# Patient Record
Sex: Female | Born: 1950
Health system: Southern US, Community
[De-identification: ages and names within clinical notes are randomized; demographics above are authoritative.]

## PROBLEM LIST (undated history)

## (undated) DIAGNOSIS — K589 Irritable bowel syndrome without diarrhea: Secondary | ICD-10-CM

## (undated) DIAGNOSIS — K219 Gastro-esophageal reflux disease without esophagitis: Secondary | ICD-10-CM

## (undated) DIAGNOSIS — J45909 Unspecified asthma, uncomplicated: Secondary | ICD-10-CM

## (undated) DIAGNOSIS — R51 Headache: Secondary | ICD-10-CM

## (undated) DIAGNOSIS — R0600 Dyspnea, unspecified: Secondary | ICD-10-CM

## (undated) DIAGNOSIS — M797 Fibromyalgia: Secondary | ICD-10-CM

## (undated) DIAGNOSIS — F419 Anxiety disorder, unspecified: Secondary | ICD-10-CM

## (undated) HISTORY — PX: TONSILLECTOMY: SUR1361

## (undated) HISTORY — PX: CHOLECYSTECTOMY: SHX55

## (undated) HISTORY — PX: ABDOMINAL HYSTERECTOMY: SHX81

---

## 2001-06-08 ENCOUNTER — Other Ambulatory Visit: Admission: RE | Admit: 2001-06-08 | Discharge: 2001-06-08 | Payer: Self-pay | Admitting: *Deleted

## 2002-10-10 ENCOUNTER — Emergency Department (HOSPITAL_COMMUNITY): Admission: EM | Admit: 2002-10-10 | Discharge: 2002-10-10 | Payer: Self-pay | Admitting: Emergency Medicine

## 2003-06-04 ENCOUNTER — Emergency Department (HOSPITAL_COMMUNITY): Admission: EM | Admit: 2003-06-04 | Discharge: 2003-06-04 | Payer: Self-pay | Admitting: Emergency Medicine

## 2006-02-07 ENCOUNTER — Ambulatory Visit (HOSPITAL_COMMUNITY): Admission: RE | Admit: 2006-02-07 | Discharge: 2006-02-07 | Payer: Self-pay | Admitting: *Deleted

## 2006-02-23 ENCOUNTER — Other Ambulatory Visit: Admission: RE | Admit: 2006-02-23 | Discharge: 2006-02-23 | Payer: Self-pay | Admitting: *Deleted

## 2006-09-08 ENCOUNTER — Ambulatory Visit (HOSPITAL_COMMUNITY): Admission: RE | Admit: 2006-09-08 | Discharge: 2006-09-08 | Payer: Self-pay | Admitting: Internal Medicine

## 2006-09-08 ENCOUNTER — Encounter (INDEPENDENT_AMBULATORY_CARE_PROVIDER_SITE_OTHER): Payer: Self-pay | Admitting: *Deleted

## 2006-09-23 ENCOUNTER — Encounter (INDEPENDENT_AMBULATORY_CARE_PROVIDER_SITE_OTHER): Payer: Self-pay | Admitting: Specialist

## 2006-09-23 ENCOUNTER — Ambulatory Visit (HOSPITAL_COMMUNITY): Admission: RE | Admit: 2006-09-23 | Discharge: 2006-09-23 | Payer: Self-pay | Admitting: General Surgery

## 2007-02-27 ENCOUNTER — Ambulatory Visit (HOSPITAL_COMMUNITY): Admission: RE | Admit: 2007-02-27 | Discharge: 2007-02-27 | Payer: Self-pay | Admitting: Obstetrics & Gynecology

## 2008-05-23 ENCOUNTER — Ambulatory Visit: Payer: Self-pay | Admitting: Internal Medicine

## 2008-06-04 ENCOUNTER — Ambulatory Visit: Payer: Self-pay | Admitting: Internal Medicine

## 2008-06-04 ENCOUNTER — Encounter: Payer: Self-pay | Admitting: Internal Medicine

## 2008-06-05 ENCOUNTER — Encounter: Payer: Self-pay | Admitting: Internal Medicine

## 2008-06-10 ENCOUNTER — Ambulatory Visit (HOSPITAL_COMMUNITY): Admission: RE | Admit: 2008-06-10 | Discharge: 2008-06-10 | Payer: Self-pay | Admitting: Obstetrics and Gynecology

## 2008-10-18 DIAGNOSIS — Z8719 Personal history of other diseases of the digestive system: Secondary | ICD-10-CM

## 2008-10-23 ENCOUNTER — Ambulatory Visit: Payer: Self-pay | Admitting: Internal Medicine

## 2009-12-31 ENCOUNTER — Emergency Department (HOSPITAL_COMMUNITY): Admission: EM | Admit: 2009-12-31 | Discharge: 2009-12-31 | Payer: Self-pay | Admitting: Emergency Medicine

## 2011-05-07 NOTE — Op Note (Signed)
Melanie Brown, Melanie Brown              ACCOUNT NO.:  [id number]   MEDICAL RECORD NO.:  [id number]          PATIENT TYPE:  AMB   LOCATION:  DAY                           FACILITY:  [facility]   PHYSICIAN:  Dalia Heading, M.D.  DATE OF BIRTH:  [birth year]   DATE OF PROCEDURE:  09/23/2006  DATE OF DISCHARGE:                                 OPERATIVE REPORT   PREOPERATIVE DIAGNOSIS:  Cholecystitis, cholelithiasis.   POSTOPERATIVE DIAGNOSIS:  Cholecystitis, cholelithiasis.   PROCEDURE:  Same procedure laparoscopic cholecystectomy.   SURGEON:  Franky Macho, M.D.   ANESTHESIA:  General endotracheal.   INDICATIONS:  The patient is a 60 year old white female who presents with  cholecystitis secondary to cholelithiasis.  Risks and benefits of the  procedure including bleeding, infection, hepatobiliary injury and the  possibility of an open procedure were fully explained to the patient, who  gave informed consent.   DESCRIPTION OF PROCEDURE:  The patient was placed in the supine position.  After induction of general endotracheal anesthesia, the abdomen was prepped  and draped using the usual sterile technique with Betadine.  Surgical site  confirmation was performed.   A supraumbilical incision was made to the fascia.  A Veress needle was  introduced into the abdominal cavity and confirmation of placement was done  using the saline drop test.  The abdomen was then insufflated to 16 mmHg  pressure.  An 11 mm trocar was introduced into the abdominal cavity under  direct visualization without difficulty.  The patient was placed in reversed  Trendelenburg position.  An additional 11-mm trocar was placed in the  epigastric region and 5-mm trocars were placed in the right upper quadrant  and right flank regions.  Liver was inspected and noted to be within normal  limits.  The gallbladder was retracted superior and laterally.  Dissection  was begun around the infundibulum of the gallbladder.  The  cystic duct was  first identified.  Its juncture to the infundibulum fully identified.  Endoclips were placed proximally and distally on the cystic duct and cystic  duct was divided.  This was likewise done to the cystic artery.  The  gallbladder was then freed away from the gallbladder fossa using Bovie  electrocautery.  The gallbladder was delivered through the epigastric trocar  site using EndoCatch bag without difficulty.  The gallbladder fossa was  inspected and no abnormal bleeding or bile leakage was noted.  Surgicel was  placed in the gallbladder fossa.  All fluid was then evacuate from the  abdominal cavity prior to removal of the trocars.   All wounds were irrigated normal saline.  All wounds were over the  supraumbilical fascia were reapproximated using an 0 Vicryl interrupted  suture.  All skin incisions were closed using staples.  Betadine ointment  and dry sterile dressings were applied.   All tape and needle counts were correct at end of the procedure.  The  patient was extubated in the operating room and went back to the recovery  room awake and stable condition.   COMPLICATIONS:  None.   SPECIMEN:  Gallbladder with stone.  BLOOD LOSS:  Minimal.      Dalia Heading, M.D.  Electronically Signed     MAJ/MEDQ  D:  09/23/2006  T:  09/24/2006  Job:  [id number]   cc:   Kingsley Callander. Ouida Sills, MD  Fax: [phone]

## 2011-05-07 NOTE — H&P (Signed)
Melanie Brown, Melanie Brown              ACCOUNT NO.:  [id number]   MEDICAL RECORD NO.:  [id number]          PATIENT TYPE:  AMB   LOCATION:  DAY                           FACILITY:  [facility]   PHYSICIAN:  Dalia Heading, M.D.  DATE OF BIRTH:  1951-07-31   DATE OF ADMISSION:  DATE OF DISCHARGE:  LH                                HISTORY & PHYSICAL   CHIEF COMPLAINT:  Cholecystitis, cholelithiasis.   HISTORY OF PRESENT ILLNESS:  The patient is a 60 year old white female who  is referred for evaluation and treatment of biliary colic secondary to  cholelithiasis.  She has been having right upper quadrant abdominal pain,  nausea, and bloating for many months.  She does have fatty food intolerance.  No fever, chills, or jaundice have been noted.   PAST MEDICAL HISTORY:  Includes intrinsic allergies.   PAST SURGICAL HISTORY:  1. Tonsillectomy.  2. C-sections.  3. Partial hysterectomy.  4. Abdominoplasty.   CURRENT MEDICATIONS:  Advair, Singulair.   ALLERGIES:  PENICILLIN, CHLORAMPHENICOL, ASPIRIN, NOVOCAIN.   REVIEW OF SYSTEMS:  The patient denies drinking or smoking.   PHYSICAL EXAMINATION:  The patient is a mildly obese white female in no  acute distress.  HEENT: Reveals no scleral icterus.  LUNGS:  Clear to auscultation with equal breath sounds bilaterally.  HEART:  Reveals a regular rate and rhythm without S3, S4, or murmurs.  ABDOMEN:  Soft and nondistended.  She is slightly tender in the right upper  quadrant to palpation.  No hepatosplenomegaly, masses, or hernias  identified.   Ultrasound of the gallbladder reveals cholelithiasis with a normal common  bile duct.   IMPRESSION:  Cholecystitis, cholelithiasis.   PLAN:  The patient is scheduled for laparoscopic cholecystectomy on  09/23/2006.  The risks and benefits of the procedure including bleeding,  infection, hepatobiliary injury, and the possibly of an open procedure were  fully explained to the patient, who gave informed  consent.      Dalia Heading, M.D.  Electronically Signed     MAJ/MEDQ  D:  09/15/2006  T:  09/16/2006  Job:  [id number]   cc:   Dalia Heading, M.D.  Fax: [phone]   Kingsley Callander. Ouida Sills, MD  Fax: [phone]

## 2011-05-07 NOTE — Op Note (Signed)
   NAME:  Melanie Brown, Melanie Brown                        ACCOUNT NO.:  [id number]   MEDICAL RECORD NO.:  [id number]                   PATIENT TYPE:  EMS   LOCATION:  ED                                   FACILITY:  [facility]   PHYSICIAN:  Dirk Dress. Katrinka Blazing, M.D.                DATE OF BIRTH:  [birth year]   DATE OF PROCEDURE:  DATE OF DISCHARGE:                                 OPERATIVE REPORT   PREOPERATIVE DIAGNOSIS:  Complex laceration, left thigh.   POSTOPERATIVE DIAGNOSIS:  Complex full-thickness laceration, left thigh.   PROCEDURE:  Layered closure, full-thickness laceration of left thigh 6 cm.   SURGEON:  Dirk Dress. Katrinka Blazing, M.D.   DESCRIPTION:  The patient had sustained a laceration to the left upper  anterior thigh while cutting some Styrofoam paper at her home.  She was  using a knife which was rather sharp.  She states that the injury occurred  immediately before she came to the emergency room.   PROCEDURE:  The area had been partially anesthetized by the emergency room  physician but when he noted that it extended down to the periosteum of the  bone he asked that I share in the care of this patient.  The area was re-  prepped with Betadine, anesthetized with 20 cc of 1% Xylocaine local  anesthesia.  The muscle appeared to be slightly lacerated but the fascia did  not need to be closed.  The subcutaneous tissue was closed in four layers  because of her moderate amount of obesity.  This was closed with 2-0, 3-0  Dexon.  The skin was closed with interrupted 4-0 Prolene.  A dressing was  placed.   The patient was advised to take Keflex 500 mg four times a day and Lortab  7.5 mg every four hours as needed for pain.   I will see her in the office on June 12, 2003.                                                Dirk Dress. Katrinka Blazing, M.D.    LCS/MEDQ  D:  06/04/2003  T:  06/04/2003  Job:  [id number]   cc:   Kingsley Callander. Ouida Sills, M.D.  [address]  [place]  [place] [address]  Fax: [phone]

## 2011-07-01 ENCOUNTER — Other Ambulatory Visit (HOSPITAL_COMMUNITY): Payer: Self-pay | Admitting: Obstetrics and Gynecology

## 2011-07-01 DIAGNOSIS — Z139 Encounter for screening, unspecified: Secondary | ICD-10-CM

## 2011-07-05 ENCOUNTER — Ambulatory Visit (HOSPITAL_COMMUNITY)
Admission: RE | Admit: 2011-07-05 | Discharge: 2011-07-05 | Disposition: A | Discharge status: Home / Self Care | Payer: BC Managed Care – PPO | Source: Ambulatory Visit | Attending: Obstetrics and Gynecology | Admitting: Obstetrics and Gynecology

## 2011-07-05 DIAGNOSIS — Z139 Encounter for screening, unspecified: Secondary | ICD-10-CM

## 2011-07-05 DIAGNOSIS — Z1231 Encounter for screening mammogram for malignant neoplasm of breast: Secondary | ICD-10-CM | POA: Insufficient documentation

## 2011-12-16 ENCOUNTER — Ambulatory Visit (HOSPITAL_COMMUNITY)
Admission: RE | Admit: 2011-12-16 | Discharge: 2011-12-16 | Disposition: A | Discharge status: Home / Self Care | Payer: BC Managed Care – PPO | Source: Ambulatory Visit | Attending: Pulmonary Disease | Admitting: Pulmonary Disease

## 2011-12-16 DIAGNOSIS — R0602 Shortness of breath: Secondary | ICD-10-CM | POA: Insufficient documentation

## 2011-12-16 MED ORDER — ALBUTEROL SULFATE (5 MG/ML) 0.5% IN NEBU
2.5000 mg | INHALATION_SOLUTION | Freq: Once | RESPIRATORY_TRACT | Status: AC
  Administered 2011-12-16: 2.5 mg via RESPIRATORY_TRACT

## 2011-12-16 NOTE — Procedures (Signed)
Melanie Brown, Melanie Brown              ACCOUNT NO.:  [id number]  MEDICAL RECORD NO.:  [id number]  LOCATION:  RESP                          FACILITY:  [facility]  PHYSICIAN:  Ladelle Teodoro L. Juanetta Gosling, M.D.DATE OF BIRTH:  October 08, 1951  DATE OF PROCEDURE: DATE OF DISCHARGE:                           PULMONARY FUNCTION TEST   Reason for pulmonary function testing is asthma.  1. Spirometry shows a mild ventilatory defect without definite airflow     obstruction. 2. Lung volumes show borderline reduction of total lung capacity. 3. DLCO is mildly reduced, but corrects for volume.  The changes in     total lung capacity and DLCO may be related to body habitus noting     the patient's height and weight. 4. There is significant bronchodilator improvement. 5. This study is consistent with the clinical diagnosis of asthma.     Kalin Amrhein L. Juanetta Gosling, M.D.     ELH/MEDQ  D:  12/16/2011  T:  12/16/2011  Job:  [id number]

## 2012-04-21 ENCOUNTER — Ambulatory Visit (HOSPITAL_COMMUNITY)
Admission: RE | Admit: 2012-04-21 | Discharge: 2012-04-21 | Disposition: A | Discharge status: Home / Self Care | Payer: BC Managed Care – PPO | Source: Ambulatory Visit | Attending: Pulmonary Disease | Admitting: Pulmonary Disease

## 2012-04-21 ENCOUNTER — Ambulatory Visit
Admission: RE | Admit: 2012-04-21 | Discharge: 2012-04-21 | Disposition: A | Discharge status: Home / Self Care | Payer: BC Managed Care – PPO | Source: Ambulatory Visit | Attending: Allergy and Immunology | Admitting: Allergy and Immunology

## 2012-04-21 ENCOUNTER — Other Ambulatory Visit: Payer: Self-pay | Admitting: Allergy and Immunology

## 2012-04-21 DIAGNOSIS — R05 Cough: Secondary | ICD-10-CM

## 2012-04-21 DIAGNOSIS — R0602 Shortness of breath: Secondary | ICD-10-CM

## 2012-04-21 DIAGNOSIS — R0609 Other forms of dyspnea: Secondary | ICD-10-CM | POA: Insufficient documentation

## 2012-04-21 DIAGNOSIS — R0989 Other specified symptoms and signs involving the circulatory and respiratory systems: Secondary | ICD-10-CM | POA: Insufficient documentation

## 2012-04-21 NOTE — Progress Notes (Signed)
*  PRELIMINARY RESULTS* Echocardiogram 2D Echocardiogram has been performed.  Melanie Brown 04/21/2012, 2:55 PM

## 2012-06-20 ENCOUNTER — Encounter (HOSPITAL_COMMUNITY): Payer: Self-pay

## 2012-06-20 ENCOUNTER — Ambulatory Visit (HOSPITAL_COMMUNITY)
Admission: RE | Admit: 2012-06-20 | Discharge: 2012-06-20 | Disposition: A | Discharge status: Home / Self Care | Payer: BC Managed Care – PPO | Source: Ambulatory Visit | Attending: Allergy and Immunology | Admitting: Allergy and Immunology

## 2012-06-20 ENCOUNTER — Other Ambulatory Visit: Payer: Self-pay | Admitting: Allergy and Immunology

## 2012-06-20 DIAGNOSIS — R0981 Nasal congestion: Secondary | ICD-10-CM

## 2012-06-20 DIAGNOSIS — R05 Cough: Secondary | ICD-10-CM

## 2012-06-20 DIAGNOSIS — R059 Cough, unspecified: Secondary | ICD-10-CM | POA: Insufficient documentation

## 2012-06-20 DIAGNOSIS — J3489 Other specified disorders of nose and nasal sinuses: Secondary | ICD-10-CM | POA: Insufficient documentation

## 2012-06-20 HISTORY — DX: Unspecified asthma, uncomplicated: J45.909

## 2012-07-27 ENCOUNTER — Other Ambulatory Visit (HOSPITAL_COMMUNITY): Payer: Self-pay | Admitting: Otolaryngology

## 2012-07-27 ENCOUNTER — Encounter (HOSPITAL_COMMUNITY): Payer: Self-pay | Admitting: Pharmacy Technician

## 2012-08-02 ENCOUNTER — Encounter (HOSPITAL_COMMUNITY)
Admission: RE | Admit: 2012-08-02 | Discharge: 2012-08-02 | Disposition: A | Discharge status: Home / Self Care | Payer: BC Managed Care – PPO | Source: Ambulatory Visit | Attending: Otolaryngology | Admitting: Otolaryngology

## 2012-08-02 ENCOUNTER — Encounter (HOSPITAL_COMMUNITY): Payer: Self-pay

## 2012-08-02 HISTORY — DX: Irritable bowel syndrome, unspecified: K58.9

## 2012-08-02 HISTORY — DX: Anxiety disorder, unspecified: F41.9

## 2012-08-02 HISTORY — DX: Fibromyalgia: M79.7

## 2012-08-02 HISTORY — DX: Headache: R51

## 2012-08-02 LAB — SURGICAL PCR SCREEN
MRSA, PCR: NEGATIVE
Staphylococcus aureus: NEGATIVE

## 2012-08-02 LAB — CBC
HCT: 43.7 % (ref 36.0–46.0)
Hemoglobin: 14.4 g/dL (ref 12.0–15.0)
MCV: 93.2 fL (ref 78.0–100.0)
RDW: 13.4 % (ref 11.5–15.5)

## 2012-08-02 LAB — BASIC METABOLIC PANEL
Calcium: 9.5 mg/dL (ref 8.4–10.5)
GFR calc Af Amer: >90 mL/min (ref >90)
GFR calc non Af Amer: >90 mL/min (ref >90)
Glucose, Bld: 92 mg/dL (ref 70–99)
Potassium: 4.1 mEq/L (ref 3.5–5.1)
Sodium: 143 mEq/L (ref 135–145)

## 2012-08-02 NOTE — Pre-Procedure Instructions (Signed)
20 Melanie Brown  08/02/2012   Your procedure is scheduled on:  08/09/12  Report to [facility] at 1100 AM.  Call this number if you have problems the morning of surgery: [phone]   Remember:   Do not eat food:After Midnight.   Take these medicines the morning of surgery with A SIP OF WATER: *all inhalers,nexium,zyrtec   Do not wear jewelry, make-up or nail polish.  Do not wear lotions, powders, or perfumes. You may wear deodorant.  Do not shave 48 hours prior to surgery. Men may shave face and neck.  Do not bring valuables to the hospital.  Contacts, dentures or bridgework may not be worn into surgery.  Leave suitcase in the car. After surgery it may be brought to your room.  For patients admitted to the hospital, checkout time is 11:00 AM the day of discharge.   Patients discharged the day of surgery will not be allowed to drive home.  Name and phone number of your driver: family  Special Instructions: CHG Shower Use Special Wash: 1/2 bottle night before surgery and 1/2 bottle morning of surgery.   Please read over the following fact sheets that you were given: Pain Booklet, Coughing and Deep Breathing, MRSA Information and Surgical Site Infection Prevention

## 2012-08-03 NOTE — Consult Note (Addendum)
Anesthesia chart review: Patient is a 61 year old female scheduled for bilateral endoscopic sinus surgery with fusion, bilateral turbinate reduction on 08/09/2012 by Dr. Jenne Pane.  History includes morbid obesity, nonsmoker, asthma, fibromyalgia, anxiety, headaches, IBS, hysterectomy, tonsillectomy, cholecystectomy.  Labs noted. CBC and BMET were WNL.  EKG on 08/02/2012 showed normal sinus rhythm and LVH.  Echo on 04/21/12 showed: - Left ventricle: The cavity size was normal. Borderline LVH. Systolic function was normal. The estimated ejection fraction was in the range of 55% to 60%. Wall motion was normal; there were no regional wall motion abnormalities. - Mitral valve: Calcified annulus. - Atrial septum: No defect or patent foramen ovale was identified.  CXR on 04/21/12 showed: Area of focal infiltrate suggested in the region of the lingula.  This is concerning for an area of bronchopneumonia in the  appropriate clinical setting. Follow-up to assess for the clearing would be recommended given lack of old films is somewhat nodular characteristic to this finding.  At her PAT appointment sats were 96%, temp 36.8 Celsius.  I called and spoke with Ms. Kanaan.  She reports that she was treated for left sided PNA in May of 2013 by Dr. Arlyss Gandy (her Asthma and Allergy physician).  She has completed two courses of Avelox since then.  She denies fever, chills, significant SOB, or persistent productive cough. She is compliant with her asthma regimen.  She has rarely noticed a mild wheeze--none today.  She feels around her baseline and has not noticed any decrease in her activity tolerance.  She is on Dulera dn Qvar inhalers daily.  She takes her albuterol inhaler daily and PRN.  She is also on Singulair, Nexium, Zyrtec, and Patanase.  I instructed her to seek follow-up with Dr. Ouida Sills (PCP) or Dr. Willa Rough if she develops any recurrent respiratory symptoms or wheezing as that may indicate her surgery should  be delayed.  However, if she continues to fell well overall without acute respiratory symptoms then anticipate she can proceed as planned.  I reviewed above with Anesthesiologist Dr. Gypsy Balsam who agrees with this plan.  I have left a message for Dr. Lamount Cranker nurse Elizebeth Brooking to call me to review patient's CXR findings and the above conversation.  I will also contact Dr. Esperanza Richters office tomorrow.  Shonna Chock, PA-C   Addendum: 08/04/12 1055 I updated Elizebeth Brooking at Dr. Lamount Cranker office and faxed a copy of patient's CXR to Dr. Willa Rough with confirmation.  Addendum: 08/04/12 1215 Dr. Willa Rough called me and recommended a repeat CXR on the morning of surgery to ensure clearing of her PNA.  I will order one to be done on arrival.  This will be evaluated by her Anesthesiologist and surgeon pre-operatively, as I will be out of town the week of her surgery.

## 2012-08-09 ENCOUNTER — Encounter (HOSPITAL_COMMUNITY)
Admission: RE | Disposition: A | Discharge status: Home / Self Care | Payer: Self-pay | Source: Ambulatory Visit | Attending: Otolaryngology

## 2012-08-09 ENCOUNTER — Ambulatory Visit (HOSPITAL_COMMUNITY): Payer: BC Managed Care – PPO

## 2012-08-09 ENCOUNTER — Encounter (HOSPITAL_COMMUNITY): Payer: Self-pay | Admitting: *Deleted

## 2012-08-09 ENCOUNTER — Encounter (HOSPITAL_COMMUNITY): Payer: Self-pay | Admitting: Vascular Surgery

## 2012-08-09 ENCOUNTER — Ambulatory Visit (HOSPITAL_COMMUNITY)
Admission: RE | Admit: 2012-08-09 | Discharge: 2012-08-09 | Disposition: A | Discharge status: Home / Self Care | Payer: BC Managed Care – PPO | Source: Ambulatory Visit | Attending: Otolaryngology | Admitting: Otolaryngology

## 2012-08-09 ENCOUNTER — Ambulatory Visit (HOSPITAL_COMMUNITY): Payer: BC Managed Care – PPO | Admitting: Vascular Surgery

## 2012-08-09 DIAGNOSIS — K219 Gastro-esophageal reflux disease without esophagitis: Secondary | ICD-10-CM | POA: Insufficient documentation

## 2012-08-09 DIAGNOSIS — J45909 Unspecified asthma, uncomplicated: Secondary | ICD-10-CM | POA: Insufficient documentation

## 2012-08-09 DIAGNOSIS — J342 Deviated nasal septum: Secondary | ICD-10-CM | POA: Insufficient documentation

## 2012-08-09 DIAGNOSIS — J343 Hypertrophy of nasal turbinates: Secondary | ICD-10-CM | POA: Insufficient documentation

## 2012-08-09 DIAGNOSIS — J329 Chronic sinusitis, unspecified: Secondary | ICD-10-CM | POA: Insufficient documentation

## 2012-08-09 DIAGNOSIS — Z01812 Encounter for preprocedural laboratory examination: Secondary | ICD-10-CM | POA: Insufficient documentation

## 2012-08-09 DIAGNOSIS — Z01810 Encounter for preprocedural cardiovascular examination: Secondary | ICD-10-CM | POA: Insufficient documentation

## 2012-08-09 DIAGNOSIS — IMO0001 Reserved for inherently not codable concepts without codable children: Secondary | ICD-10-CM | POA: Insufficient documentation

## 2012-08-09 DIAGNOSIS — J330 Polyp of nasal cavity: Secondary | ICD-10-CM | POA: Insufficient documentation

## 2012-08-09 DIAGNOSIS — F411 Generalized anxiety disorder: Secondary | ICD-10-CM | POA: Insufficient documentation

## 2012-08-09 DIAGNOSIS — J338 Other polyp of sinus: Secondary | ICD-10-CM | POA: Insufficient documentation

## 2012-08-09 HISTORY — PX: SINUS ENDO WITH FUSION: SHX5329

## 2012-08-09 HISTORY — PX: SPHENOIDECTOMY: SHX2421

## 2012-08-09 SURGERY — SURGERY, PARANASAL SINUS, ENDOSCOPIC, WITH NASAL SEPTOPLASTY, TURBINOPLASTY, AND MAXILLARY SINUSOTOMY
Anesthesia: General | Site: Nose | Laterality: Bilateral | Wound class: Clean Contaminated

## 2012-08-09 MED ORDER — CLINDAMYCIN PHOSPHATE 600 MG/50ML IV SOLN
INTRAVENOUS | Status: DC | PRN
  Administered 2012-08-09: 600 mg via INTRAVENOUS

## 2012-08-09 MED ORDER — GLYCOPYRROLATE 0.2 MG/ML IJ SOLN
INTRAMUSCULAR | Status: DC | PRN
  Administered 2012-08-09: .6 mg via INTRAVENOUS

## 2012-08-09 MED ORDER — LIDOCAINE-EPINEPHRINE 1 %-1:100000 IJ SOLN
INTRAMUSCULAR | Status: DC | PRN
  Administered 2012-08-09: 20 mL

## 2012-08-09 MED ORDER — NEOSTIGMINE METHYLSULFATE 1 MG/ML IJ SOLN
INTRAMUSCULAR | Status: DC | PRN
  Administered 2012-08-09: 4 mg via INTRAVENOUS

## 2012-08-09 MED ORDER — ALBUTEROL SULFATE (5 MG/ML) 0.5% IN NEBU
2.5000 mg | INHALATION_SOLUTION | Freq: Once | RESPIRATORY_TRACT | Status: AC
Start: 2012-08-09 — End: 2012-08-09
  Administered 2012-08-09: 2.5 mg via RESPIRATORY_TRACT
  Filled 2012-08-09: qty 0.5

## 2012-08-09 MED ORDER — HYDRALAZINE HCL 20 MG/ML IJ SOLN
INTRAMUSCULAR | Status: DC | PRN
  Administered 2012-08-09 (×4): 5 mg via INTRAVENOUS

## 2012-08-09 MED ORDER — HYDROMORPHONE HCL PF 1 MG/ML IJ SOLN
INTRAMUSCULAR | Status: AC
  Filled 2012-08-09: qty 1

## 2012-08-09 MED ORDER — LIDOCAINE-EPINEPHRINE 1 %-1:100000 IJ SOLN
INTRAMUSCULAR | Status: AC
  Filled 2012-08-09: qty 1

## 2012-08-09 MED ORDER — SODIUM CHLORIDE 0.9 % IR SOLN
Status: DC | PRN
  Administered 2012-08-09 (×2): 1

## 2012-08-09 MED ORDER — LIDOCAINE HCL 4 % MT SOLN
OROMUCOSAL | Status: DC | PRN
  Administered 2012-08-09: 4 mL via TOPICAL

## 2012-08-09 MED ORDER — PREDNISONE 10 MG PO TABS: 40.0000 mg | ORAL_TABLET | Freq: Every day | ORAL | Status: DC

## 2012-08-09 MED ORDER — HYDROCODONE-ACETAMINOPHEN 5-325 MG PO TABS
1.0000 | ORAL_TABLET | Freq: Once | ORAL | Status: AC
  Administered 2012-08-09: 2 via ORAL

## 2012-08-09 MED ORDER — SODIUM CHLORIDE 0.9 % IR SOLN
Status: DC | PRN
  Administered 2012-08-09: 1

## 2012-08-09 MED ORDER — OXYMETAZOLINE HCL 0.05 % NA SOLN
NASAL | Status: AC
  Filled 2012-08-09: qty 15

## 2012-08-09 MED ORDER — STERILE WATER FOR IRRIGATION IR SOLN
Status: DC | PRN
  Administered 2012-08-09: 1

## 2012-08-09 MED ORDER — DEXAMETHASONE SODIUM PHOSPHATE 4 MG/ML IJ SOLN
INTRAMUSCULAR | Status: DC | PRN
  Administered 2012-08-09: 8 mg via INTRAVENOUS

## 2012-08-09 MED ORDER — LACTATED RINGERS IV SOLN
INTRAVENOUS | Status: DC
  Administered 2012-08-09: 12:00:00 via INTRAVENOUS

## 2012-08-09 MED ORDER — CLINDAMYCIN PHOSPHATE 600 MG/50ML IV SOLN
INTRAVENOUS | Status: AC
  Filled 2012-08-09: qty 50

## 2012-08-09 MED ORDER — FENTANYL CITRATE 0.05 MG/ML IJ SOLN
INTRAMUSCULAR | Status: DC | PRN
  Administered 2012-08-09: 100 ug via INTRAVENOUS
  Administered 2012-08-09 (×3): 50 ug via INTRAVENOUS

## 2012-08-09 MED ORDER — MUPIROCIN CALCIUM 2 % NA OINT
TOPICAL_OINTMENT | NASAL | Status: DC | PRN
  Administered 2012-08-09: 1 via NASAL

## 2012-08-09 MED ORDER — PROPOFOL 10 MG/ML IV EMUL
INTRAVENOUS | Status: DC | PRN
  Administered 2012-08-09: 150 mg via INTRAVENOUS

## 2012-08-09 MED ORDER — ALBUTEROL SULFATE (5 MG/ML) 0.5% IN NEBU
INHALATION_SOLUTION | RESPIRATORY_TRACT | Status: AC
  Administered 2012-08-09: 2.5 mg via RESPIRATORY_TRACT
  Filled 2012-08-09: qty 0.5

## 2012-08-09 MED ORDER — MUPIROCIN 2 % EX OINT
TOPICAL_OINTMENT | CUTANEOUS | Status: DC
  Filled 2012-08-09: qty 22

## 2012-08-09 MED ORDER — ROCURONIUM BROMIDE 100 MG/10ML IV SOLN
INTRAVENOUS | Status: DC | PRN
  Administered 2012-08-09: 50 mg via INTRAVENOUS

## 2012-08-09 MED ORDER — ALBUTEROL SULFATE HFA 108 (90 BASE) MCG/ACT IN AERS
INHALATION_SPRAY | RESPIRATORY_TRACT | Status: DC | PRN
  Administered 2012-08-09: 2 via RESPIRATORY_TRACT

## 2012-08-09 MED ORDER — HYDROCODONE-ACETAMINOPHEN 5-325 MG PO TABS
ORAL_TABLET | ORAL | Status: AC
  Filled 2012-08-09: qty 2

## 2012-08-09 MED ORDER — MIDAZOLAM HCL 5 MG/5ML IJ SOLN
INTRAMUSCULAR | Status: DC | PRN
  Administered 2012-08-09: 2 mg via INTRAVENOUS

## 2012-08-09 MED ORDER — ONDANSETRON HCL 4 MG/2ML IJ SOLN
INTRAMUSCULAR | Status: DC | PRN
  Administered 2012-08-09 (×2): 4 mg via INTRAVENOUS

## 2012-08-09 MED ORDER — CLINDAMYCIN HCL 300 MG PO CAPS: 300.0000 mg | ORAL_CAPSULE | Freq: Three times a day (TID) | ORAL | Status: AC

## 2012-08-09 MED ORDER — LACTATED RINGERS IV SOLN
INTRAVENOUS | Status: DC | PRN
  Administered 2012-08-09 (×2): via INTRAVENOUS

## 2012-08-09 MED ORDER — OXYMETAZOLINE HCL 0.05 % NA SOLN
NASAL | Status: DC | PRN
  Administered 2012-08-09 (×3): 1 via NASAL

## 2012-08-09 MED ORDER — HYDROMORPHONE HCL PF 1 MG/ML IJ SOLN
0.2500 mg | INTRAMUSCULAR | Status: DC | PRN
  Administered 2012-08-09 (×4): 0.5 mg via INTRAVENOUS

## 2012-08-09 MED ORDER — DROPERIDOL 2.5 MG/ML IJ SOLN: 0.6250 mg | INTRAMUSCULAR | Status: DC | PRN

## 2012-08-09 MED ORDER — MUPIROCIN CALCIUM 2 % EX CREA
TOPICAL_CREAM | CUTANEOUS | Status: AC
  Filled 2012-08-09: qty 15

## 2012-08-09 MED ORDER — HYDROCODONE-ACETAMINOPHEN 5-325 MG PO TABS: 2.0000 | ORAL_TABLET | ORAL | Status: AC | PRN

## 2012-08-09 SURGICAL SUPPLY — 55 items
ATTRACTOMAT 16X20 MAGNETIC DRP (DRAPES) IMPLANT
BLADE INF TURB ROT M4 2 5PK (BLADE) ×3 IMPLANT
BLADE RAD 60 XTREME SINUS 4MM (BLADE) ×3 IMPLANT
BLADE RAD40 ROTATE 4M 4 5PK (BLADE) ×3 IMPLANT
BLADE RAD60 ROTATE M4 4 5PK (BLADE) IMPLANT
BLADE ROTATE TRICUT 4X13 M4 (BLADE) ×3 IMPLANT
BLADE TRICUT ROTATE M4 4 5PK (BLADE) ×3 IMPLANT
CANISTER SUCTION 2500CC (MISCELLANEOUS) ×6 IMPLANT
CLOTH BEACON ORANGE TIMEOUT ST (SAFETY) ×3 IMPLANT
COAGULATOR SUCT SWTCH 10FR 6 (ELECTROSURGICAL) ×3 IMPLANT
CORDS BIPOLAR (ELECTRODE) IMPLANT
CRADLE DONUT ADULT HEAD (MISCELLANEOUS) ×3 IMPLANT
DECANTER SPIKE VIAL GLASS SM (MISCELLANEOUS) ×6 IMPLANT
DRESSING TELFA 8X3 (GAUZE/BANDAGES/DRESSINGS) IMPLANT
DRSG NASOPORE 8CM (GAUZE/BANDAGES/DRESSINGS) ×6 IMPLANT
ELECT REM PT RETURN 9FT ADLT (ELECTROSURGICAL) ×3
ELECTRODE REM PT RTRN 9FT ADLT (ELECTROSURGICAL) ×2 IMPLANT
FILTER ARTHROSCOPY CONVERTOR (FILTER) ×3 IMPLANT
FLOSEAL 10ML (HEMOSTASIS) IMPLANT
GLOVE BIO SURGEON STRL SZ7.5 (GLOVE) ×3 IMPLANT
GLOVE SURG SS PI 6.0 STRL IVOR (GLOVE) ×3 IMPLANT
GLOVE SURG SS PI 6.5 STRL IVOR (GLOVE) ×3 IMPLANT
GOWN STRL NON-REIN LRG LVL3 (GOWN DISPOSABLE) ×6 IMPLANT
KIT BASIN OR (CUSTOM PROCEDURE TRAY) ×3 IMPLANT
KIT ROOM TURNOVER OR (KITS) ×3 IMPLANT
MARKER SPHERE PSV REFLC NDI (MISCELLANEOUS) IMPLANT
NEEDLE SPNL 22GX3.5 QUINCKE BK (NEEDLE) IMPLANT
NEEDLE SPNL 25GX3.5 QUINCKE BL (NEEDLE) ×3 IMPLANT
NS IRRIG 1000ML POUR BTL (IV SOLUTION) ×3 IMPLANT
PAD ARMBOARD 7.5X6 YLW CONV (MISCELLANEOUS) ×6 IMPLANT
PAD ENT ADHESIVE 25PK (MISCELLANEOUS) ×3 IMPLANT
PATTIES SURGICAL .5 X3 (DISPOSABLE) ×9 IMPLANT
SHEATH ENDOSCRUB 0 DEG (SHEATH) ×3 IMPLANT
SHEATH ENDOSCRUB 30 DEG (SHEATH) ×3 IMPLANT
SHEATH ENDOSCRUB 45 DEG (SHEATH) ×3 IMPLANT
SOLUTION ANTI FOG 6CC (MISCELLANEOUS) ×6 IMPLANT
SOLUTION BETADINE 4OZ (MISCELLANEOUS) ×3 IMPLANT
SPECIMEN JAR SMALL (MISCELLANEOUS) ×3 IMPLANT
SPLINT NASAL DOYLE BI-VL (GAUZE/BANDAGES/DRESSINGS) ×3 IMPLANT
STRIP CLOSURE SKIN 1/2X4 (GAUZE/BANDAGES/DRESSINGS) ×3 IMPLANT
SUT CHROMIC 2 0 SH (SUTURE) ×3 IMPLANT
SUT CHROMIC 4 0 PS 2 18 (SUTURE) ×3 IMPLANT
SUT ETHILON 3 0 PS 1 (SUTURE) IMPLANT
SWAB COLLECTION DEVICE MRSA (MISCELLANEOUS) IMPLANT
SYR 50ML SLIP (SYRINGE) IMPLANT
SYR BULB 3OZ (MISCELLANEOUS) ×3 IMPLANT
TOWEL OR 17X24 6PK STRL BLUE (TOWEL DISPOSABLE) ×3 IMPLANT
TOWEL OR 17X26 10 PK STRL BLUE (TOWEL DISPOSABLE) ×3 IMPLANT
TOWEL OR NON WOVEN STRL DISP B (DISPOSABLE) ×3 IMPLANT
TRACKER ENT INSTRUMENT (MISCELLANEOUS) ×3 IMPLANT
TRACKER ENT PATIENT (MISCELLANEOUS) ×3 IMPLANT
TRAY ENT MC OR (CUSTOM PROCEDURE TRAY) ×3 IMPLANT
TUBE CONNECTING 12X1/4 (SUCTIONS) ×6 IMPLANT
WATER STERILE IRR 1000ML POUR (IV SOLUTION) ×3 IMPLANT
WIPE INSTRUMENT VISIWIPE 73X73 (MISCELLANEOUS) IMPLANT

## 2012-08-09 NOTE — Anesthesia Procedure Notes (Signed)
Procedure Name: Intubation Date/Time: 08/09/2012 12:35 PM Performed by: Margaree Mackintosh Pre-anesthesia Checklist: Patient identified, Timeout performed, Emergency Drugs available, Suction available and Patient being monitored Patient Re-evaluated:Patient Re-evaluated prior to inductionOxygen Delivery Method: Circle system utilized Preoxygenation: Pre-oxygenation with 100% oxygen Intubation Type: IV induction Ventilation: Mask ventilation without difficulty and Oral airway inserted - appropriate to patient size Laryngoscope Size: Mac and 3 Grade View: Grade I Tube type: Oral Tube size: 7.0 mm Number of attempts: 1 Airway Equipment and Method: Stylet and LTA kit utilized Placement Confirmation: ETT inserted through vocal cords under direct vision,  positive ETCO2 and breath sounds checked- equal and bilateral Secured at: 21 cm Tube secured with: Tape Dental Injury: Teeth and Oropharynx as per pre-operative assessment

## 2012-08-09 NOTE — Anesthesia Preprocedure Evaluation (Signed)
Anesthesia Evaluation  Patient identified by MRN, date of birth, ID band Patient awake    Reviewed: Allergy & Precautions, H&P , NPO status , Patient's Chart, lab work & pertinent test results  History of Anesthesia Complications Negative for: history of anesthetic complications  Airway       Dental   Pulmonary asthma ,    Pulmonary exam normal       Cardiovascular negative cardio ROS  Rhythm:Regular Rate:Normal     Neuro/Psych  Headaches, Anxiety    GI/Hepatic Neg liver ROS, GERD-  Controlled and Medicated,  Endo/Other  Morbid obesity  Renal/GU negative Renal ROS     Musculoskeletal  (+) Fibromyalgia -  Abdominal   Peds  Hematology   Anesthesia Other Findings   Reproductive/Obstetrics                           Anesthesia Physical Anesthesia Plan  ASA: II  Anesthesia Plan: General   Post-op Pain Management:    Induction: Intravenous  Airway Management Planned: Oral ETT  Additional Equipment:   Intra-op Plan:   Post-operative Plan: Extubation in OR  Informed Consent: I have reviewed the patients History and Physical, chart, labs and discussed the procedure including the risks, benefits and alternatives for the proposed anesthesia with the patient or authorized representative who has indicated his/her understanding and acceptance.   Dental advisory given  Plan Discussed with: CRNA, Anesthesiologist and Surgeon  Anesthesia Plan Comments:         Anesthesia Quick Evaluation

## 2012-08-09 NOTE — Progress Notes (Signed)
No repeat chest x-ray per Dr. Krista Blue.

## 2012-08-09 NOTE — Anesthesia Postprocedure Evaluation (Signed)
Anesthesia Post Note  Patient: Melanie Brown  Procedure(s) Performed: Procedure(s) (LRB): SINUS ENDO WITH FUSION (Bilateral) TURBINATE REDUCTION (Bilateral) SEPTOPLASTY WITH ETHMOIDECTOMY, AND MAXILLARY ANTROSTOMY (Bilateral) SPHENOIDECTOMY (Bilateral)  Anesthesia type: general  Patient location: PACU  Post pain: Pain level controlled  Post assessment: Patient's Cardiovascular Status Stable  Last Vitals:  Filed Vitals:   08/09/12 1641  BP: 141/71  Pulse: 65  Temp:   Resp: 15    Post vital signs: Reviewed and stable  Level of consciousness: sedated  Complications: No apparent anesthesia complications

## 2012-08-09 NOTE — Preoperative (Signed)
Beta Blockers   Reason not to administer Beta Blockers:Not Applicable 

## 2012-08-09 NOTE — OR Nursing (Signed)
Melanie Reading, MD refused a foley catheter pre-operatively.  Oralia Manis, RN

## 2012-08-09 NOTE — H&P (Signed)
Melanie Brown is an 61 y.o. female.   Chief Complaint: sinusitis HPI: 61 year old female with long history of chronic sinusitis not responsive to medical therapy.  Imaging shows involvement of all sinuses.  Also with nasal obstruction.  Has asthma and aspirin allergy.  Past Medical History  Diagnosis Date  . Asthma   . Headache   . Fibromyalgia   . IBS (irritable bowel syndrome)   . Anxiety     Past Surgical History  Procedure Date  . Tonsillectomy   . Abdominal hysterectomy   . Cesarean section   . Cholecystectomy     History reviewed. No pertinent family history. Social History:  reports that she has never smoked. She does not have any smokeless tobacco history on file. She reports that she does not drink alcohol or use illicit drugs.  Allergies:  Allergies  Allergen Reactions  . Aspirin Shortness Of Breath and Rash  . Procaine Hcl Other (See Comments)     muscle spasm of neck  . Penicillins Rash    Medications Prior to Admission  Medication Sig Dispense Refill  . albuterol (PROVENTIL) (2.5 MG/3ML) 0.083% nebulizer solution Take 2.5 mg by nebulization daily.      . beclomethasone (QVAR) 80 MCG/ACT inhaler Inhale 3 puffs into the lungs daily. Mid day      . cetirizine (ZYRTEC) 10 MG tablet Take 10 mg by mouth daily.      Marland Kitchen esomeprazole (NEXIUM) 40 MG capsule Take 40 mg by mouth daily before breakfast.      . Mometasone Furo-Formoterol Fum (DULERA) 200-5 MCG/ACT AERO Inhale 2 puffs into the lungs 2 (two) times daily.      . montelukast (SINGULAIR) 10 MG tablet Take 10 mg by mouth at bedtime.      . Olopatadine HCl (PATANASE NA) Place 2 sprays into the nose 2 (two) times daily.        No results found for this or any previous visit (from the past 48 hour(s)). No results found.  Review of Systems  Constitutional: Positive for malaise/fatigue.  Neurological: Positive for headaches.  All other systems reviewed and are negative.    Blood pressure 167/93, pulse 67,  temperature 97.5 F (36.4 C), temperature source Oral, resp. rate 18, SpO2 98.00%. Physical Exam  Constitutional: She is oriented to person, place, and time. She appears well-developed and well-nourished. No distress.  HENT:  Head: Normocephalic and atraumatic.  Right Ear: External ear normal.  Left Ear: External ear normal.  Nose: Nose normal.  Mouth/Throat: Oropharynx is clear and moist.  Eyes: Conjunctivae and EOM are normal. Pupils are equal, round, and reactive to light.  Neck: Normal range of motion. Neck supple.  Cardiovascular: Normal rate.   Respiratory: Effort normal.  GI:       Did not examine.  Genitourinary:       Did not examine.  Musculoskeletal: Normal range of motion.  Neurological: She is alert and oriented to person, place, and time. No cranial nerve deficit.  Skin: Skin is warm and dry.  Psychiatric: She has a normal mood and affect. Her behavior is normal. Judgment and thought content normal.     Assessment/Plan Chronic pansinusitis, turbinate hypertrophy To OR for endoscopic sinus surgery to address all sinuses using Fusion image guidance.  Will also perform turbinate reduction.  Rahmir Beever 08/09/2012, 12:08 PM

## 2012-08-09 NOTE — Transfer of Care (Signed)
Immediate Anesthesia Transfer of Care Note  Patient: Melanie Brown  Procedure(s) Performed: Procedure(s) (LRB): SINUS ENDO WITH FUSION (Bilateral) TURBINATE REDUCTION (Bilateral) SEPTOPLASTY WITH ETHMOIDECTOMY, AND MAXILLARY ANTROSTOMY (Bilateral) SPHENOIDECTOMY (Bilateral)  Patient Location: PACU  Anesthesia Type: General  Level of Consciousness: awake, alert  and oriented  Airway & Oxygen Therapy: Patient Spontanous Breathing and Patient connected to face mask oxygen  Post-op Assessment: Report given to PACU RN and Post -op Vital signs reviewed and stable  Post vital signs: Reviewed and stable  Complications: No apparent anesthesia complications

## 2012-08-09 NOTE — Brief Op Note (Signed)
08/09/2012  3:11 PM  PATIENT:  Clydia Llano  61 y.o. female  PRE-OPERATIVE DIAGNOSIS:  Chronic Sinusitis; Turbinate Hypertrophy  POST-OPERATIVE DIAGNOSIS:  Chronic Sinusitis; Turbinate Hypertrophy  PROCEDURE:  Procedure(s) (LRB): BILATERAL TOTAL ETHMOIDECTOMY BILATERAL FRONTAL RECESS EXPLORATION BILATERAL SPHENOIDOTOMY BILATERAL MAXILLARY ANTROSTOMY FUSION IMAGE GUIDANCE BILATERAL TURBINATE REDUCTION SEPTOPLASTY  SURGEON:  Surgeon(s) and Role:    * Christia Reading, MD - Primary  PHYSICIAN ASSISTANT:   ASSISTANTS: none   ANESTHESIA:   general  EBL:  Total I/O In: 1000 [I.V.:1000] Out: 900 [Blood:900]  BLOOD ADMINISTERED:none  DRAINS: none   LOCAL MEDICATIONS USED:  LIDOCAINE   SPECIMEN:  Source of Specimen:  Sinus contents  DISPOSITION OF SPECIMEN:  PATHOLOGY  COUNTS:  YES  TOURNIQUET:  * No tourniquets in log *  DICTATION: .Other Dictation: Dictation Number [id number]  PLAN OF CARE: Discharge to home after PACU  PATIENT DISPOSITION:  PACU - hemodynamically stable.   Delay start of Pharmacological VTE agent (>24hrs) due to surgical blood loss or risk of bleeding: no

## 2012-08-10 ENCOUNTER — Encounter (HOSPITAL_COMMUNITY): Payer: Self-pay | Admitting: Otolaryngology

## 2012-08-10 NOTE — Op Note (Signed)
Melanie Brown, Melanie Brown              ACCOUNT NO.:  [id number]  MEDICAL RECORD NO.:  [id number]  LOCATION:  [facility]                         FACILITY:  [facility]  PHYSICIAN:  Antony Contras, MD     DATE OF BIRTH:  03-02-1951  DATE OF PROCEDURE:  08/09/2012 DATE OF DISCHARGE:  08/09/2012                              OPERATIVE REPORT   PREOPERATIVE DIAGNOSES: 1. Chronic pansinusitis. 2. Inferior turbinate hypertrophy.  POSTOPERATIVE DIAGNOSES: 1. Chronic pansinusitis. 2. Inferior turbinate hypertrophy. 3. Septal deviation.  PROCEDURE:  Bilateral total ethmoidectomy, bilateral frontal recess exploration, bilateral sphenoidotomy, bilateral maxillary antrostomy, Fusion image guidance, bilateral inferior turbinate reduction, septoplasty.  SURGEON:  Excell Seltzer. Jenne Pane, MD  ANESTHESIA:  General endotracheal anesthesia.  COMPLICATIONS:  None.  INDICATION:  The patient is a 61 year old female who has a long history of chronic sinusitis that has not responded well to medical therapy. All sinuses are uninvolved on imaging.  She also has nasal obstruction that appears to be due primarily to turbinate hypertrophy.  She has ASPIRIN ALLERGY as well and may have Samter triad.  She presents to the operating room for surgical management.  FINDINGS:  There was polyp in the middle meatus on each side as well as through the sinuses at places.  There was thick tenacious mucus in the right sphenoid sinus primarily but also from both frontal recesses and in the ethmoids.  Sinus mucosa was edematous throughout the sinuses. The inferior turbinates were enlarged mostly with soft tissue.  Septum was found to have a significant deviation toward the left with a posterior bony spur and an inferior spur anteriorly.  DESCRIPTION OF PROCEDURE:  The patient was identified in the holding room and informed consent having been obtained including the discussion of risks, benefits, and alternatives.  The patient was brought  to the operative suite and put on the operating table in a supine position. Anesthesia was induced, and the patient was intubated by the Anesthesia Team without difficulty.  The patient was given intravenous antibiotics during the case.  The eyes were lubricated and the antenna was attached to the forehead for the fusion system.  The face was prepped and draped in sterile fashion.  The patient was then registered to the fusion system in a standard fashion.  Instruments were registered as well.  The nose was packed on each side with Afrin-soaked pledgets for several minutes.  Right-sided pledgets were removed, and the lateral nasal wall on the right side was injected with 1% lidocaine with 1:100,000 epinephrine.  Polyps from the middle meatus was then removed with microdebrider and this continued removing the ethmoid bulla and entering the anterior ethmoid.  There was polyp around the ethmoid bulla that was removed as well.  An angled telescope was then used to evaluate the lateral nasal wall.  The uncinate process was elevated off the wall using a curved probe and was then divided using a backbiter.  The uncinate process was then removed with the microdebrider.  The curved microdebrider was then used to widen the maxillary opening, primarily inferiorly and posteriorly resulting in a wide antrostomy.  The 0-degree telescope was again used and the ethmoid septations were further removed using a  straight microdebrider back to the sphenoid rostrum.  With excision of the ethmoid tissues, the middle turbinate was lateralized with a Therapist, nutritional, and this allowed exposure of the superior meatus. Polyp was removed in this region as was portion of the superior turbinate.  The sphenoid opening was found and then widened laterally and inferiorly as well as superiorly using the microdebrider.  Inside the sphenoid sinus, we found thick mucus which was then removed carefully with suction yielding a  very tenacious thick mucus.  Once completed, the middle turbinate was re-medialized and further septations removed from the ethmoid following the skull base in retrograde fashion using an angled telescope and microdebrider.  This was done into the frontal recess, where a central ethmoid cell was exposed medial to the frontal recess.  The actual frontal recess was then exposed by removing septation between the ethmoid and the frontal using microdebrider.  This resulted in wide patency of the frontal recess.  Afrin pledgets were placed in the right sinus cavity while surgery was then performed on the left side.  Surgery was similar on the left side including lateral nasal wall with local injection, removal of polyp from the middle meatus, removal of the uncinate process and widening of the maxillary ostium, removal of the ethmoid bulla, anterior ethmoid cells, and posterior ethmoid cells, re-medialization of the middle turbinate and partial removal of the superior turbinate with poly, widening of the sphenoid opening superiorly and laterally.  A retrograde dissection of the skull base using an angled telescope and angled debrider, and dissection of the frontal recess yielding a patent frontal recess.  Afrin pledgets were also placed in the left side after completion of the sinus surgery. Each step of the sinus surgery was met with more bleeding than typical and required ongoing suction and irrigation to continue surgery.  At this point, the inferior turbinates were injected with local anesthetic. Stab incisions were made at the anterior head of each inferior turbinate and soft tissues were elevated off the underlying bone using Therapist, nutritional.  Turbinate blade on the microdebrider was then used to remove submucosal tissue keeping the overlying mucosa and underlying bone intact.  The soft tissues were redraped and the turbinates were lateralized.  At this point, with the septum well exposed,  the decision was made to proceed with septoplasty in order to improve nasal breathing through the left side.  Thus, the left septum was injected with local anesthetic.  A Killian incision was made along the anterior septum and the subperichondrial flap was then elevated beyond the bony cartilaginous junction.  The junction was divided.  The cartilage portion of the inferior septal spur was then removed using a Therapist, nutritional.  This allowed better exposure of the posterior septum.  The posterior septal bone was then removed in a piecemeal fashion using a Duckbill forceps.  This removed the posterior spur.  The bony portion of the inferior spur was then isolated from soft tissues using a Therapist, nutritional and was then removed using the osteotome.  This resulted in small holes on each septal flap that were not immediately cross from one another.  Soft tissues were redraped.  The nose was suctioned and pledgets removed.  Half of the nasal pore packed, coated and Bactroban ointment was then placed in each ethmoid cavity and saturated with saline.  Doyle splints were placed on the each side of the nose and coated with Bactroban ointment.  The Killian incision was closed with 4- 0 chromic suture in  a simple interrupted fashion.  The stents were placed in their normal position and secured with a 2-0 chromic suture in a standard drain stitch.  The throat was suctioned.  The patient was returned to Anesthesia for wake up.  She was extubated and moved to the recovery room in stable condition.     Antony Contras, MD     DDB/MEDQ  D:  08/09/2012  T:  08/10/2012  Job:  [id number]

## 2012-12-10 IMAGING — CT CT MAXILLOFACIAL W/O CM
3 series · 16 of 47 positions shown, 19 images · non-contrast
Comparison: None.

CLINICAL DATA: Nasal congestion and cough.  History of headaches,
asthma.  No prior surgery.

CT MAXILLOFACIAL WITHOUT CONTRAST
TECHNIQUE: Multidetector CT imaging of the maxillofacial
structures was performed. Multiplanar CT image reconstructions were
also generated.

[Series 2: max soft 2.0 h31s · axial · 0.35mm/px · z∈[+32,+182]mm · 10 of 118 slices shown, 13 images]
[im 9/118  brain]
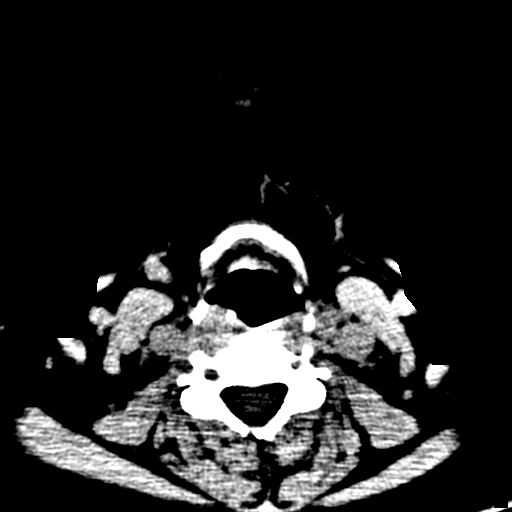
[im 9/118  bone]
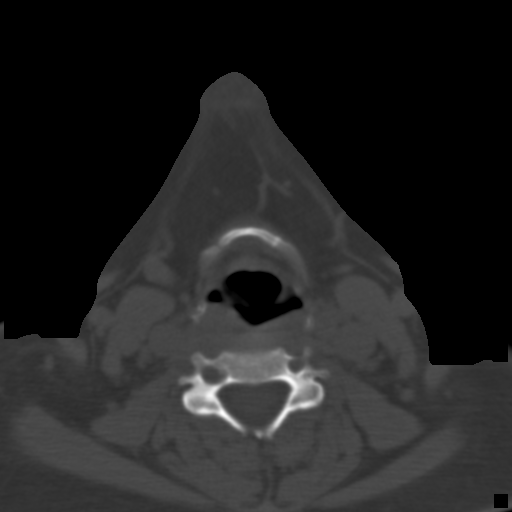
[im 21/118  bone]
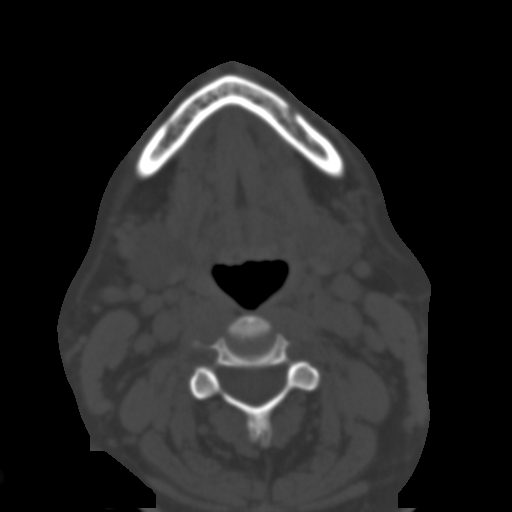
[im 33/118  bone]
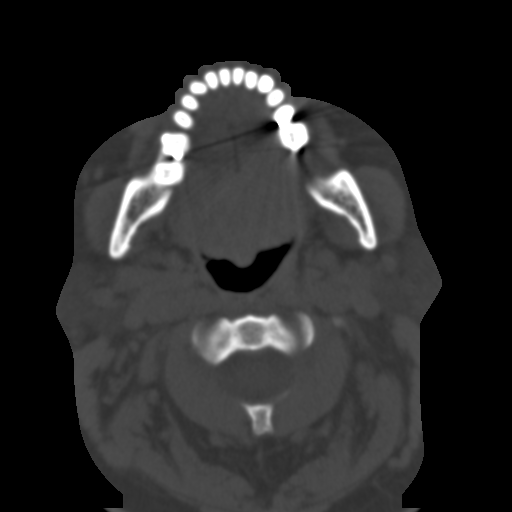
[im 41/118  bone]
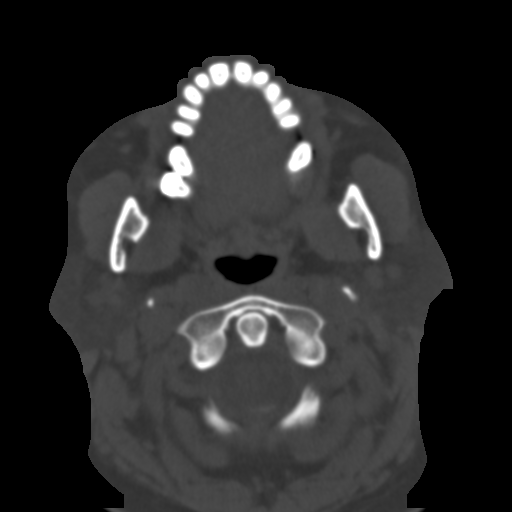
[im 53/118  brain]
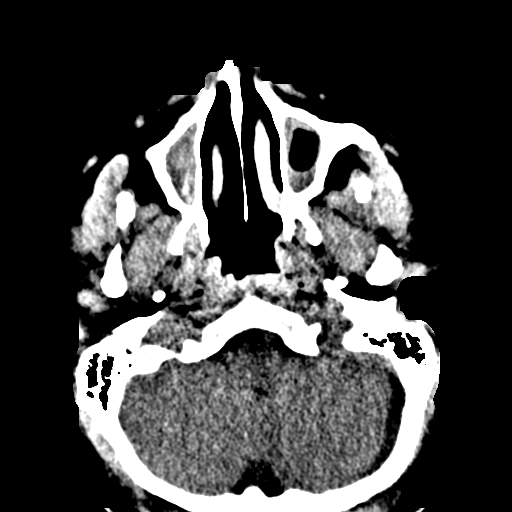
[im 53/118  bone]
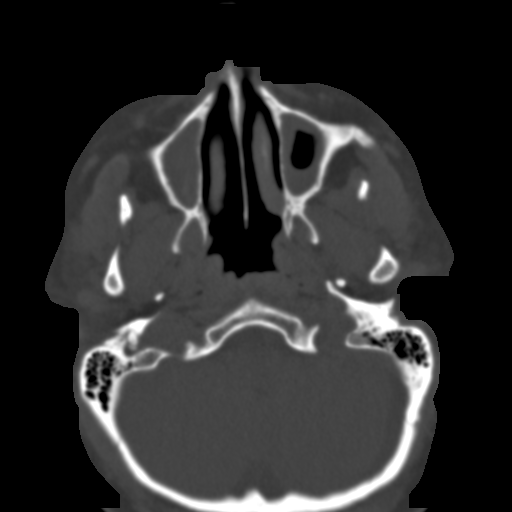
[im 65/118  bone]
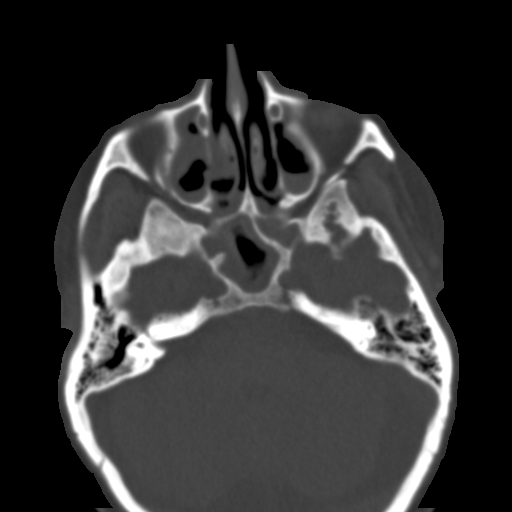
[im 77/118  bone]
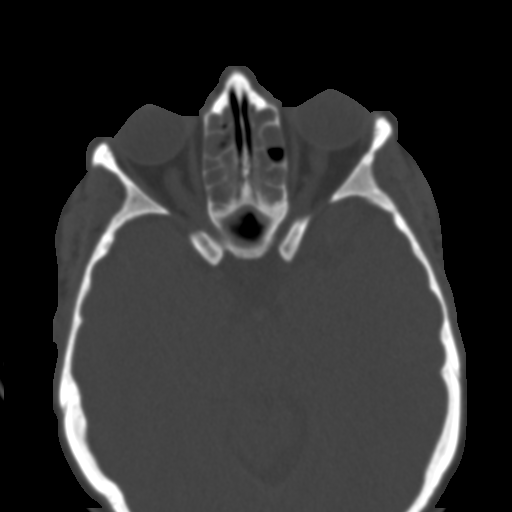
[im 89/118  bone]
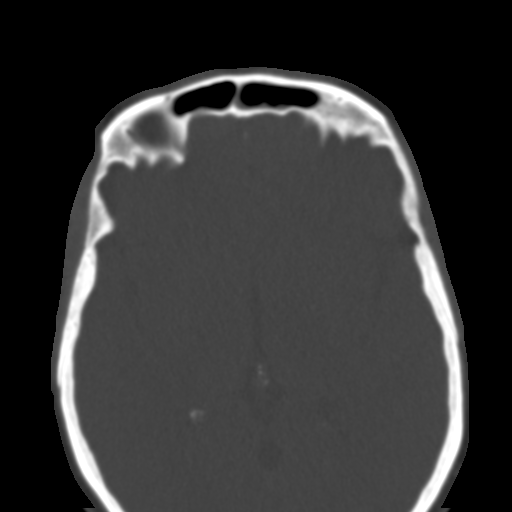
[im 97/118  brain]
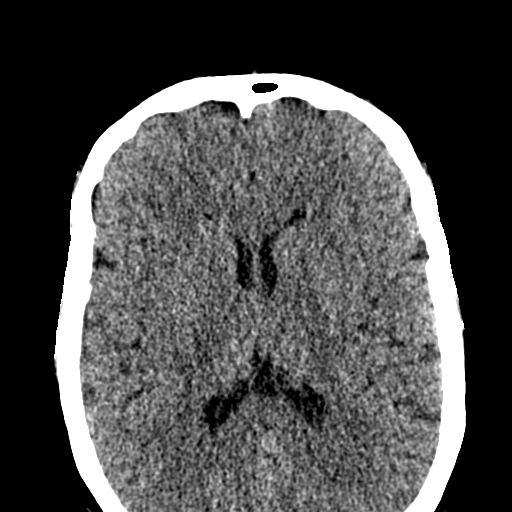
[im 97/118  bone]
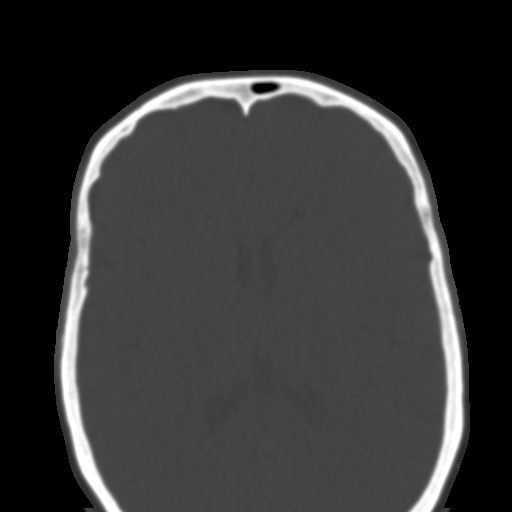
[im 109/118  bone]
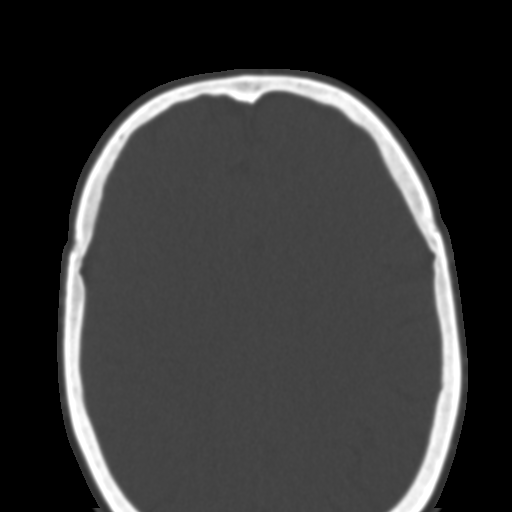

[Series 4: max st coronal · coronal · 0.33mm/px · 3 of 82 slices shown]
[im 28/82  bone]
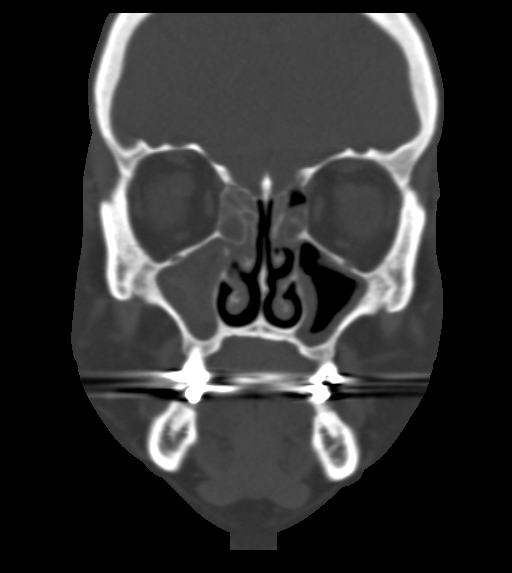
[im 37/82  bone]
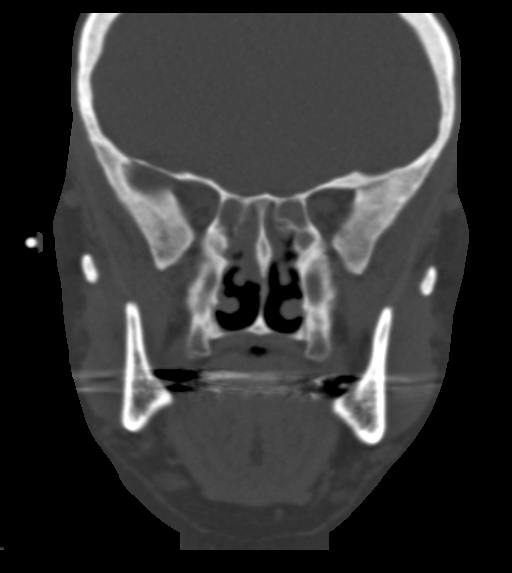
[im 46/82  bone]
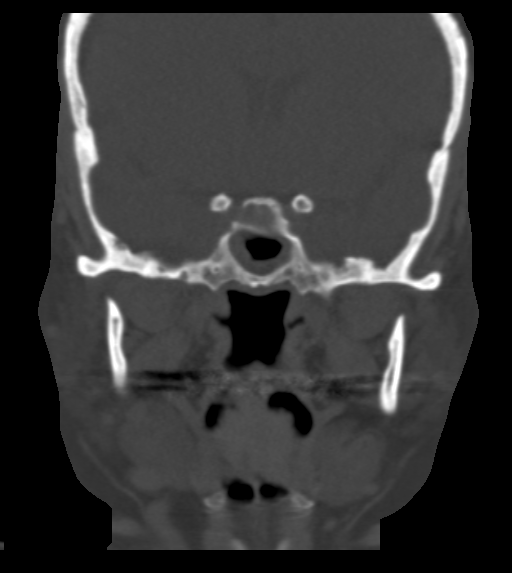

[Series 5: max st sagittal · sagittal · 0.32mm/px · 3 of 77 slices shown]
[im 26/77  bone]
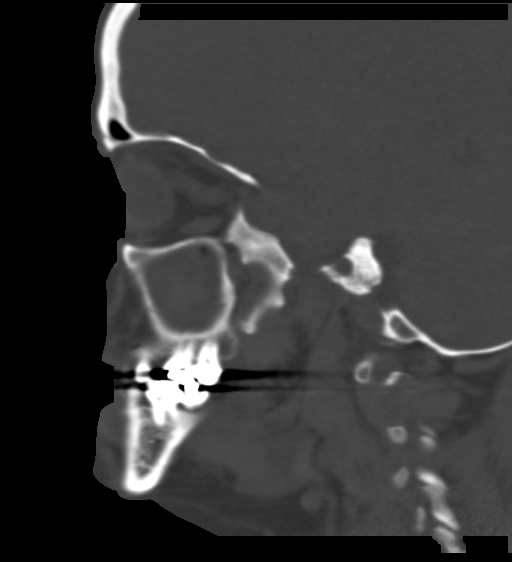
[im 39/77  bone]
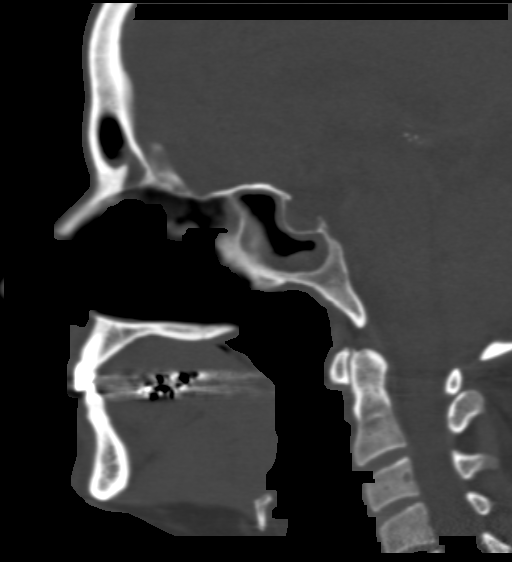
[im 51/77  bone]
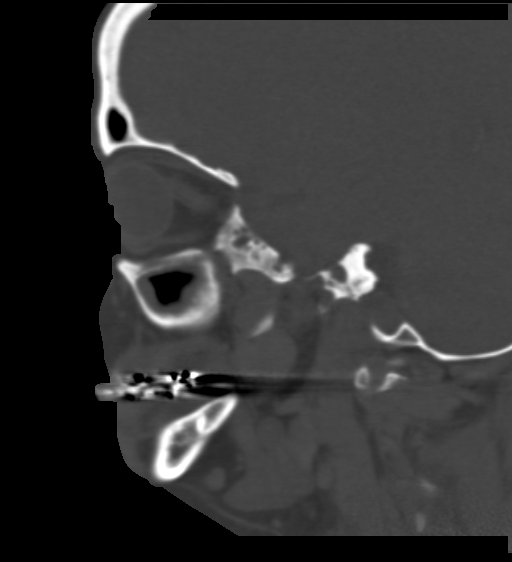

[16 of 47 positions shown; findings below may reference images not displayed]

FINDINGS: There is significant mucoperiosteal thickening and
opacity involving the paranasal sinuses.  Air fluid level is
identified within the left maxillary sinus.  Visualized mastoid air
cells are well-aerated.  There is significant artifact from dental
work.
IMPRESSION: 1.  Changes consistent with pansinusitis.
2.  Air fluid level within the left maxillary sinus suggest an
acute component.

## 2013-11-28 ENCOUNTER — Encounter: Payer: Self-pay | Admitting: Internal Medicine

## 2013-12-03 DIAGNOSIS — G43909 Migraine, unspecified, not intractable, without status migrainosus: Secondary | ICD-10-CM | POA: Insufficient documentation

## 2013-12-12 ENCOUNTER — Other Ambulatory Visit: Payer: Self-pay | Admitting: Internal Medicine

## 2013-12-12 DIAGNOSIS — M542 Cervicalgia: Secondary | ICD-10-CM

## 2013-12-23 ENCOUNTER — Other Ambulatory Visit: Payer: BC Managed Care – PPO

## 2013-12-26 ENCOUNTER — Encounter: Payer: Self-pay | Admitting: Neurology

## 2013-12-28 ENCOUNTER — Encounter: Payer: Self-pay | Admitting: Neurology

## 2013-12-28 ENCOUNTER — Encounter (INDEPENDENT_AMBULATORY_CARE_PROVIDER_SITE_OTHER): Payer: Self-pay

## 2013-12-28 ENCOUNTER — Other Ambulatory Visit: Payer: Self-pay | Admitting: Neurology

## 2013-12-28 ENCOUNTER — Ambulatory Visit (INDEPENDENT_AMBULATORY_CARE_PROVIDER_SITE_OTHER): Payer: BC Managed Care – PPO | Admitting: Neurology

## 2013-12-28 VITALS — BP 143/85 | HR 80 | Ht 63.0 in | Wt 270.0 lb

## 2013-12-28 DIAGNOSIS — IMO0001 Reserved for inherently not codable concepts without codable children: Secondary | ICD-10-CM

## 2013-12-28 DIAGNOSIS — M797 Fibromyalgia: Secondary | ICD-10-CM

## 2013-12-28 DIAGNOSIS — M5412 Radiculopathy, cervical region: Secondary | ICD-10-CM

## 2013-12-28 MED ORDER — PREGABALIN 75 MG PO CAPS: 75.0000 mg | ORAL_CAPSULE | Freq: Two times a day (BID) | ORAL | Status: DC

## 2013-12-28 NOTE — Progress Notes (Signed)
[facility]    Provider:  Dr Janann Colonel Referring Provider: [name], MD Primary Care Physician:  [name], MD  CC:  Cervical radiculopathy  HPI:  Melanie Brown is a 63 y.o. female here as a referral from Dr. Willey Blade for concerns of cervical radiculopathy  Patient notes pain, weakness and sensory changes in her RUE, predominantly in the C6/7 distribution. Symptoms Dece 8, 2014 with pain in cervical region radiating down RUE. Continued to have severe pain and muscle spasms in R shoulder region. Tried doing stretching, heating pad, massage with no benefit. Began to feel some sensory loss and pins and needles sensation in her index and third finger on the right. Saw Dr Willey Blade on December 15, prescribed prednisone and Hydrocodone/APAP 5/325mg . Medicine gave little to no benefit. Returned to Dr Willey Blade on Dec 24, given Perfocet 5/325mg , gave mild relief. Had MRI done December 26. Symptoms resolved slightly on December 28, stopped oxycodone. Muscle spasms getting better but pain and sensory changes not improving much. Pain is worse in the morning to early afternoon. Has limited ROM in RUE due to pain.   No history of trauma to the head or neck. No heavy lifting/exercising prior to event starting.   Has diagnosis of fibromyalgia. Initially attributed above symptoms to the fibromyalgia.   [facility] completed at [facility] (images reviewed) with showing mild central disc protrusion at C3-4 which mildly indents the ventral cord surface, left posterolateral broad-based disc protrusion and potentially uncovertebral spurring at C5-6, mildly narrowing the proximal left neural foramen. Mild disc bluge at C6-7 along with a suspected small right posterolateral disc protrusion which appears to narrow the proximal right neural foramen.   Review of Systems: Out of a complete 14 system review, the patient complains of only the following symptoms, and all other reviewed systems are negative. Positive  numbness weakness joint pain joint swelling aching muscles diarrhea allergies fatigue  History   Social History  . Marital Status: Single    Spouse Name: N/A    Number of Children: 2  . Years of Education: college   Occupational History  . [employer]    Social History Main Topics  . Smoking status: Never Smoker   . Smokeless tobacco: Never Used  . Alcohol Use: No  . Drug Use: No  . Sexual Activity: Not on file   Other Topics Concern  . Not on file   Social History Narrative  . No narrative on file    No family history on file.  Past Medical History  Diagnosis Date  . Asthma   . Headache(784.0)   . Fibromyalgia   . IBS (irritable bowel syndrome)   . Anxiety     Past Surgical History  Procedure Laterality Date  . Tonsillectomy    . Abdominal hysterectomy    . Cesarean section    . Cholecystectomy    . Sinus endo with fusion  08/09/2012    Procedure: SINUS ENDO WITH FUSION;  Surgeon: Melida Quitter, MD;  Location: [facility];  Service: ENT;  Laterality: Bilateral;  Bilateral Frontal Recess Exploration  . Sphenoidectomy  08/09/2012    Procedure: SPHENOIDECTOMY;  Surgeon: Melida Quitter, MD;  Location: [facility];  Service: ENT;  Laterality: Bilateral;    Current Outpatient Prescriptions  Medication Sig Dispense Refill  . albuterol (PROVENTIL) (2.5 MG/3ML) 0.083% nebulizer solution Take 2.5 mg by nebulization daily.      . beclomethasone (QVAR) 80 MCG/ACT inhaler Inhale 3 puffs into the lungs daily. Mid day      .  cetirizine (ZYRTEC) 10 MG tablet Take 10 mg by mouth daily.      Marland Kitchen esomeprazole (NEXIUM) 40 MG capsule Take 40 mg by mouth daily before breakfast.      . Mometasone Furo-Formoterol Fum (DULERA) 200-5 MCG/ACT AERO Inhale 2 puffs into the lungs 2 (two) times daily.      . montelukast (SINGULAIR) 10 MG tablet Take 10 mg by mouth at bedtime.      . predniSONE (DELTASONE) 10 MG tablet Take 4 tablets (40 mg total) by mouth daily.  100 tablet  0  . valACYclovir  (VALTREX) 500 MG tablet Take 500 mg by mouth daily.      Arvid Right 150 MG injection Inject 150 mg into the skin every 14 (fourteen) days.       No current facility-administered medications for this visit.    Allergies as of 12/28/2013 - Review Complete 12/28/2013  Allergen Reaction Noted  . Aspirin Shortness Of Breath and Rash 05/23/2008  . Chloramphenicols  12/28/2013  . Procaine hcl Other (See Comments) 05/23/2008  . Penicillins Rash 05/23/2008    Vitals: BP 143/85  Pulse 80  Ht 5\' 3"  (1.6 m)  Wt 270 lb (122.471 kg)  BMI 47.84 kg/m2 Last Weight:  Wt Readings from Last 1 Encounters:  12/28/13 270 lb (122.471 kg)   Last Height:   Ht Readings from Last 1 Encounters:  12/28/13 5\' 3"  (1.6 m)     Physical exam: Exam: Gen: NAD, conversant Eyes: anicteric sclerae, moist conjunctivae HENT: Atraumatic, oropharynx clear Neck: Trachea midline; supple,  Lungs: CTA, no wheezing, rales, rhonic                          CV: RRR, no MRG Abdomen: Soft, non-tender;  Extremities: No peripheral edema  Skin: Normal temperature, no rash,  Psych: Appropriate affect, pleasant  Neuro: MS: AA&Ox3, appropriately interactive, normal affect   Speech: fluent w/o paraphasic error  Memory: good recent and remote recall  CN: PERRL, EOMI no nystagmus, no ptosis, sensation intact to LT V1-V3 bilat, face symmetric, no weakness, hearing grossly intact, palate elevates symmetrically, shoulder shrug 5/5 bilat,  tongue protrudes midline, no fasiculations noted.  Motor: normal bulk and tone Strength: 5/5  In all extremities  Coord: rapid alternating and point-to-point (FNF, HTS) movements intact.  Reflexes: symmetrical, bilat downgoing toes  Sens: LT intact in all extremities with exception of mildly diminished LT in C7 distribution of RUE  Gait: posture, stance, stride and arm-swing normal. Tandem gait intact. Able to walk on heels and toes. Romberg absent.   Assessment:  After physical and  neurologic examination, review of laboratory studies, imaging, neurophysiology testing and pre-existing records, assessment will be reviewed on the problem list.  Plan:  Treatment plan and additional workup will be reviewed under Problem List.  1)Cervical radiculopathy 2)Fibromyalgia  62y/o woman with history of fibromyalgia presenting for initial evaluation of symptoms consistent with a right cervical radiculopathy. Patient notes symptoms are slowly improving, she continues have pain and sensory changes. Has tried prednisone taper, and oxycodone with minimal symptomatic relief. Discussed different therapeutic options with patient. Will try a course of physical therapy and Lyrica 75 mg twice a day. If no improvement of symptoms in one month would consider referral to orthopedic surgery for possible injections versus surgical intervention.  Jim Like, DO  [facility] [address] [place], [place] [address]  Phone [phone] Fax [phone]

## 2013-12-28 NOTE — Patient Instructions (Signed)
Overall you are doing fairly well but I do want to suggest a few things today:   Remember to drink plenty of fluid, eat healthy meals and do not skip any meals. Try to eat protein with a every meal and eat a healthy snack such as fruit or nuts in between meals. Try to keep a regular sleep-wake schedule and try to exercise daily, particularly in the form of walking, 20-30 minutes a day, if you can.   As far as your medications are concerned, I would like to suggest trying a medication called Lyrica. You will take one 75mg  capsule twice a day  We will refer you to physical therapy for symptomatic relief. You will be called to schedule this sometime next week.   Follow up as needed. Please call us with any interim questions, concerns, problems, updates or refill requests.   My clinical assistant and will answer any of your questions and relay your messages to me and also relay most of my messages to you.   Our phone number is [phone]. We also have an after hours call service for urgent matters and there is a physician on-call for urgent questions. For any emergencies you know to call 911 or go to the nearest emergency room

## 2014-01-14 ENCOUNTER — Encounter: Payer: Self-pay | Admitting: Neurology

## 2014-01-15 ENCOUNTER — Telehealth (HOSPITAL_COMMUNITY): Payer: Self-pay

## 2014-01-16 ENCOUNTER — Ambulatory Visit (HOSPITAL_COMMUNITY): Payer: BC Managed Care – PPO | Admitting: Physical Therapy

## 2014-03-07 ENCOUNTER — Encounter: Payer: Self-pay | Admitting: Internal Medicine

## 2014-04-12 ENCOUNTER — Other Ambulatory Visit: Payer: Self-pay | Admitting: Neurology

## 2014-04-12 ENCOUNTER — Telehealth: Payer: Self-pay | Admitting: *Deleted

## 2014-04-12 MED ORDER — PREGABALIN 50 MG PO CAPS: 50.0000 mg | ORAL_CAPSULE | Freq: Two times a day (BID) | ORAL | Status: DC

## 2014-04-12 NOTE — Telephone Encounter (Signed)
Please let her know we will increase it to 125mg  twice a day. She should take one 75mg  capsule and one 50mg  capsule twice a day. The prescription was sent to her pharmacy. Thanks.

## 2014-04-12 NOTE — Telephone Encounter (Signed)
I called back.  Spoke with the patient.  Relayed Dr Sabino Donovan message.  She verbalized understanding.

## 2014-04-12 NOTE — Telephone Encounter (Signed)
Patient is requesting a dose increase on Lyrica.  Please advise.

## 2014-04-12 NOTE — Telephone Encounter (Signed)
Patient requesting higher dosage ofpregabalin (LYRICA) 75 MG capsule due to nerve pain in arm has began to increase.  Please call patient..thanks

## 2014-05-10 ENCOUNTER — Other Ambulatory Visit: Payer: Self-pay | Admitting: Neurology

## 2014-05-10 NOTE — Telephone Encounter (Signed)
Rx signed and faxed.

## 2014-05-18 ENCOUNTER — Other Ambulatory Visit: Payer: Self-pay | Admitting: Neurology

## 2014-05-20 NOTE — Telephone Encounter (Signed)
Rx signed and faxed.

## 2014-10-26 ENCOUNTER — Other Ambulatory Visit: Payer: Self-pay | Admitting: Neurology

## 2014-10-29 NOTE — Telephone Encounter (Signed)
I spoke with patient today who will ask her PCP to refill meds.  She will call us back if needed.  Patient is aware Dr Janann Colonel is no longer at our practice, and she will need to call our office to be assigned a new MD.

## 2015-07-09 ENCOUNTER — Encounter: Payer: Self-pay | Admitting: Internal Medicine

## 2015-08-29 ENCOUNTER — Other Ambulatory Visit: Payer: Self-pay | Admitting: *Deleted

## 2015-08-29 MED ORDER — OMALIZUMAB 150 MG ~~LOC~~ SOLR: 300.0000 mg | SUBCUTANEOUS | Status: DC

## 2015-09-18 ENCOUNTER — Encounter: Payer: Self-pay | Admitting: Gastroenterology

## 2015-09-23 ENCOUNTER — Ambulatory Visit (INDEPENDENT_AMBULATORY_CARE_PROVIDER_SITE_OTHER): Payer: BC Managed Care – PPO

## 2015-09-23 DIAGNOSIS — J45901 Unspecified asthma with (acute) exacerbation: Secondary | ICD-10-CM

## 2015-09-23 DIAGNOSIS — J4540 Moderate persistent asthma, uncomplicated: Secondary | ICD-10-CM | POA: Diagnosis not present

## 2015-10-07 ENCOUNTER — Ambulatory Visit (INDEPENDENT_AMBULATORY_CARE_PROVIDER_SITE_OTHER): Payer: BC Managed Care – PPO

## 2015-10-07 DIAGNOSIS — J4541 Moderate persistent asthma with (acute) exacerbation: Secondary | ICD-10-CM

## 2015-10-07 DIAGNOSIS — J4540 Moderate persistent asthma, uncomplicated: Secondary | ICD-10-CM

## 2015-10-21 ENCOUNTER — Ambulatory Visit (INDEPENDENT_AMBULATORY_CARE_PROVIDER_SITE_OTHER): Payer: BC Managed Care – PPO

## 2015-10-21 DIAGNOSIS — J4540 Moderate persistent asthma, uncomplicated: Secondary | ICD-10-CM

## 2015-10-21 DIAGNOSIS — J4531 Mild persistent asthma with (acute) exacerbation: Secondary | ICD-10-CM

## 2015-10-21 NOTE — Progress Notes (Signed)
THIS PATIENT RECEIVES XOLAIR 225MG  EVERY 14 DAYS.  NOT 300MG .

## 2015-10-22 DIAGNOSIS — J4540 Moderate persistent asthma, uncomplicated: Secondary | ICD-10-CM | POA: Diagnosis not present

## 2015-10-30 MED ORDER — OMALIZUMAB 150 MG ~~LOC~~ SOLR
225.0000 mg | SUBCUTANEOUS | Status: AC
  Administered 2015-09-23 – 2016-12-21 (×32): 225 mg via SUBCUTANEOUS

## 2015-10-30 NOTE — Addendum Note (Signed)
Addended by: Janan Ridge E on: 10/30/2015 11:43 AM   Modules accepted: Orders

## 2015-11-04 ENCOUNTER — Ambulatory Visit (INDEPENDENT_AMBULATORY_CARE_PROVIDER_SITE_OTHER): Payer: BC Managed Care – PPO

## 2015-11-04 DIAGNOSIS — J4540 Moderate persistent asthma, uncomplicated: Secondary | ICD-10-CM

## 2015-11-11 ENCOUNTER — Encounter: Payer: Self-pay | Admitting: Gastroenterology

## 2015-11-11 ENCOUNTER — Ambulatory Visit: Payer: BC Managed Care – PPO | Admitting: Gastroenterology

## 2015-11-18 ENCOUNTER — Ambulatory Visit (INDEPENDENT_AMBULATORY_CARE_PROVIDER_SITE_OTHER): Payer: BC Managed Care – PPO

## 2015-11-18 DIAGNOSIS — J4540 Moderate persistent asthma, uncomplicated: Secondary | ICD-10-CM | POA: Diagnosis not present

## 2015-11-20 ENCOUNTER — Encounter: Payer: Self-pay | Admitting: Gastroenterology

## 2015-11-20 ENCOUNTER — Ambulatory Visit (INDEPENDENT_AMBULATORY_CARE_PROVIDER_SITE_OTHER): Payer: BC Managed Care – PPO | Admitting: Gastroenterology

## 2015-11-20 ENCOUNTER — Other Ambulatory Visit: Payer: Self-pay

## 2015-11-20 VITALS — BP 135/75 | HR 72 | Temp 98.1°F | Ht 63.5 in | Wt 264.0 lb

## 2015-11-20 DIAGNOSIS — R1310 Dysphagia, unspecified: Secondary | ICD-10-CM

## 2015-11-20 DIAGNOSIS — R194 Change in bowel habit: Secondary | ICD-10-CM | POA: Diagnosis not present

## 2015-11-20 DIAGNOSIS — K219 Gastro-esophageal reflux disease without esophagitis: Secondary | ICD-10-CM | POA: Diagnosis not present

## 2015-11-20 DIAGNOSIS — R195 Other fecal abnormalities: Secondary | ICD-10-CM | POA: Diagnosis not present

## 2015-11-20 DIAGNOSIS — R1013 Epigastric pain: Secondary | ICD-10-CM

## 2015-11-20 DIAGNOSIS — K625 Hemorrhage of anus and rectum: Secondary | ICD-10-CM | POA: Insufficient documentation

## 2015-11-20 MED ORDER — NA SULFATE-K SULFATE-MG SULF 17.5-3.13-1.6 GM/177ML PO SOLN: 1.0000 | Freq: Once | ORAL | Status: DC

## 2015-11-20 NOTE — Progress Notes (Signed)
Subjective:    Patient ID: Melanie Brown, female    DOB: Sep 06, 1951, 64 y.o.   MRN: [id number]  [name], MD  HPI Has IBS-D/LOOSE  FOR 31 YEARS. IN JUN 2016 DIET CHANGED AND HAD CHANGE IN BOWEL IN HABITS/FORM STOOL(#2). SX LASTED ABOUT A WEEK. ASSOCIATED WITH SUBJECTIVE FEVER/FELT REALLY COLD, EXTREME CHILLS AND SEVERE LEFT SIDED PAIN. OCCUR EVERY MONTH SINCE JUN-SEP. EVALUATED BY GYN BUT NO SOURCE FOUND. HAS A SENSITIVE COLON. AND CAN FEEL PAIN IN LEFT SIDE. CONSTIPATION IS GONE AND NOW BACK TO LOOSE STOOLS/DIARRHEA SINCE CHANGING DIET. PAIN MOSTLY IN LEFT SIDE. WEIGHT LOSS: 25 LBS ON THE DIET. HAD BLOOD IN STOOL WHEN IT WAS HARD. NAUSEA: QUEAZY EVERY DAY IN THE AM. RELIEVED WITH CUP OF COFFEE. HAS ASTHMA-SOB BASELINE. TAKES Eual Fines. HAS A GLOBUS SENSATION IN THROAT BUT FOOD DOESN'T GET STUCK. BEEN ON NEXIUM OFF AND ON FOR REFLUX. STOPPED IN MAY 2016 DUE TO COSTS OF MEDS. BMs: 3-5 DAILY(#5-6)  PT DENIES FEVER, CHILLS, HEMATEMESIS, vomiting, melena, CHEST PAIN,  constipation, abdominal pain, problems with sedation, or heartburn or indigestion.  Past Medical History  Diagnosis Date  . Asthma   . Headache(784.0)   . Fibromyalgia   . IBS (irritable bowel syndrome)   . Anxiety    Past Surgical History  Procedure Laterality Date  . Tonsillectomy    . Abdominal hysterectomy    . Cesarean section    . Cholecystectomy    . Sinus endo with fusion  08/09/2012    Procedure: SINUS ENDO WITH FUSION;  Surgeon: Melida Quitter, MD;  Location: [facility];  Service: ENT;  Laterality: Bilateral;  Bilateral Frontal Recess Exploration  . Sphenoidectomy  08/09/2012    Procedure: SPHENOIDECTOMY;  Surgeon: Melida Quitter, MD;  Location: [facility];  Service: ENT;  Laterality: Bilateral;   Allergies  Allergen Reactions  . Aspirin Shortness Of Breath and Rash  . Chloramphenicols   . Procaine Hcl Other (See Comments)     muscle spasm of neck  . Penicillins Rash    Current Outpatient Prescriptions    Medication Sig Dispense Refill  . albuterol (PROVENTIL) (2.5 MG/3ML) 0.083% nebulizer solution Take 2.5 mg by nebulization daily.    . beclomethasone (QVAR) 80 MCG/ACT inhaler Inhale 3 puffs into the lungs daily. Mid day PRN   . cetirizine (ZYRTEC) 10 MG tablet Take 10 mg by mouth daily.    Marland Kitchen esomeprazole (NEXIUM) 40 MG capsule Take 40 mg by mouth daily before breakfast.    .      . mometasone (NASONEX) 50 MCG/ACT nasal spray Place 1 spray into the nose 2 (two) times daily.    . Mometasone Furo-Formoterol Fum (DULERA) 200-5 MCG/ACT AERO Inhale 2 puffs into the lungs 2 (two) times daily.    . montelukast (SINGULAIR) 10 MG tablet Take 10 mg by mouth at bedtime.    .      .      .        Family History  Problem Relation Age of Onset  . Colon cancer Father 57    COLON RESECTED  . Colon polyps Neg Hx     Social History   Social History  . Marital Status: Single    Spouse Name: N/A  . Number of Children: 2  . Years of Education: college   Occupational History  . [employer]    Social History Main Topics  . Smoking status: Never Smoker   . Smokeless tobacco: Never Used  .  Alcohol Use: No  . Drug Use: No  . Sexual Activity: Not on file    Review of Systems PER HPI OTHERWISE ALL SYSTEMS ARE NEGATIVE.    Objective:   Physical Exam  Constitutional: She is oriented to person, place, and time. She appears well-developed and well-nourished. No distress.  HENT:  Head: Normocephalic and atraumatic.  Mouth/Throat: Oropharynx is clear and moist. No oropharyngeal exudate.  Eyes: Pupils are equal, round, and reactive to light. No scleral icterus.  Neck: Normal range of motion. Neck supple.  Cardiovascular: Normal rate, regular rhythm and normal heart sounds.   Pulmonary/Chest: Effort normal and breath sounds normal. No respiratory distress.  Abdominal: Soft. Bowel sounds are normal. She exhibits no distension. There is tenderness. There is no rebound and no guarding.   MODERATE TTP IN LLQ  Musculoskeletal: She exhibits no edema.  Lymphadenopathy:    She has no cervical adenopathy.  Neurological: She is alert and oriented to person, place, and time.  NO FOCAL DEFICITS  Psychiatric: She has a normal mood and affect.  Vitals reviewed.     Assessment & Plan:

## 2015-11-20 NOTE — Progress Notes (Signed)
ON RECALL  °

## 2015-11-20 NOTE — Patient Instructions (Addendum)
IF YOU CONSUME DAIRY, ADD LACTASE 3 PILLS WITH MEALS UP TO THREE TIMES A DAY.  DRINK WATER TO KEEP YOUR URINE LIGHT YELLOW.  FOLLOW A HIGH FIBER/LOW FAT DIET. AVOID ITEMS THAT CAUSE BLOATING & GAS. SEE INFO BELOW.  AVOID FOOD THAT TRIGGERS REFLUX. SEE INFO BELOW.   UPPER ENDOSCOPY TO DILATE YOUR ESOPHAGUS/COLONOSCOPY IN 2-3 WEEKS.  FOLLOW UP IN 4 MOS.      Lifestyle and home remedies TO MANAGE REFLUX/CHEST PAIN  You may eliminate or reduce the frequency of heartburn by making the following lifestyle changes:  . Control your weight. Being overweight is a major risk factor for heartburn and GERD. Excess pounds put pressure on your abdomen, pushing up your stomach and causing acid to back up into your esophagus.   . Eat smaller meals. 4 TO 6 MEALS A DAY. This reduces pressure on the lower esophageal sphincter, helping to prevent the valve from opening and acid from washing back into your esophagus.   Dolphus Jenny your belt. Clothes that fit tightly around your waist put pressure on your abdomen and the lower esophageal sphincter.   . Eliminate heartburn triggers. Everyone has specific triggers. Common triggers such as fatty or fried foods, spicy food, tomato sauce, carbonated beverages, alcohol, chocolate, mint, garlic, onion, caffeine and nicotine may make heartburn worse.   Marland Kitchen Avoid stooping or bending. Tying your shoes is OK. Bending over for longer periods to weed your garden isn't, especially soon after eating.   . Don't lie down after a meal. Wait at least three to four hours after eating before going to bed, and don't lie down right after eating.   Marland Kitchen PUT THE HEAD OF YOUR BED ON 6 INCH BLOCKS.   Alternative medicine . Several home remedies exist for treating GERD, but they provide only temporary relief. They include drinking baking soda (sodium bicarbonate) added to water or drinking other fluids such as baking soda mixed with cream of tartar and water.  . Although these liquids  create temporary relief by neutralizing, washing away or buffering acids, eventually they aggravate the situation by adding gas and fluid to your stomach, increasing pressure and causing more acid reflux. Further, adding more sodium to your diet may increase your blood pressure and add stress to your heart, and excessive bicarbonate ingestion can alter the acid-base balance in your body.      Low-Fat Diet BREADS, CEREALS, PASTA, RICE, DRIED PEAS, AND BEANS These products are high in carbohydrates and most are low in fat. Therefore, they can be increased in the diet as substitutes for fatty foods. They too, however, contain calories and should not be eaten in excess. Cereals can be eaten for snacks as well as for breakfast.   FRUITS AND VEGETABLES It is good to eat fruits and vegetables. Besides being sources of fiber, both are rich in vitamins and some minerals. They help you get the daily allowances of these nutrients. Fruits and vegetables can be used for snacks and desserts.  MEATS Limit lean meat, chicken, [place], and fish to no more than 6 ounces per day. Beef, Pork, and Lamb Use lean cuts of beef, pork, and lamb. Lean cuts include:  Extra-lean ground beef.  Arm roast.  Sirloin tip.  Center-cut ham.  Round steak.  Loin chops.  Rump roast.  Tenderloin.  Trim all fat off the outside of meats before cooking. It is not necessary to severely decrease the intake of red meat, but lean choices should be made. Lean meat  is rich in protein and contains a highly absorbable form of iron. Premenopausal women, in particular, should avoid reducing lean red meat because this could increase the risk for low red blood cells (iron-deficiency anemia).  Chicken and [place] These are good sources of protein. The fat of poultry can be reduced by removing the skin and underlying fat layers before cooking. Chicken and [place] can be substituted for lean red meat in the diet. Poultry should not be fried or  covered with high-fat sauces. Fish and Shellfish Fish is a good source of protein. Shellfish contain cholesterol, but they usually are low in saturated fatty acids. The preparation of fish is important. Like chicken and [place], they should not be fried or covered with high-fat sauces. EGGS Egg whites contain no fat or cholesterol. They can be eaten often. Try 1 to 2 egg whites instead of whole eggs in recipes or use egg substitutes that do not contain yolk. MILK AND DAIRY PRODUCTS Use skim or 1% milk instead of 2% or whole milk. Decrease whole milk, natural, and processed cheeses. Use nonfat or low-fat (2%) cottage cheese or low-fat cheeses made from vegetable oils. Choose nonfat or low-fat (1 to 2%) yogurt. Experiment with evaporated skim milk in recipes that call for heavy cream. Substitute low-fat yogurt or low-fat cottage cheese for sour cream in dips and salad dressings. Have at least 2 servings of low-fat dairy products, such as 2 glasses of skim (or 1%) milk each day to help get your daily calcium intake. FATS AND OILS Reduce the total intake of fats, especially saturated fat. Butterfat, lard, and beef fats are high in saturated fat and cholesterol. These should be avoided as much as possible. Vegetable fats do not contain cholesterol, but certain vegetable fats, such as coconut oil, palm oil, and palm kernel oil are very high in saturated fats. These should be limited. These fats are often used in bakery goods, processed foods, popcorn, oils, and nondairy creamers. Vegetable shortenings and some peanut butters contain hydrogenated oils, which are also saturated fats. Read the labels on these foods and check for saturated vegetable oils. Unsaturated vegetable oils and fats do not raise blood cholesterol. However, they should be limited because they are fats and are high in calories. Total fat should still be limited to 30% of your daily caloric intake. Desirable liquid vegetable oils are corn oil,  cottonseed oil, olive oil, canola oil, safflower oil, soybean oil, and sunflower oil. Peanut oil is not as good, but small amounts are acceptable. Buy a heart-healthy tub margarine that has no partially hydrogenated oils in the ingredients. Mayonnaise and salad dressings often are made from unsaturated fats, but they should also be limited because of their high calorie and fat content. Seeds, nuts, peanut butter, olives, and avocados are high in fat, but the fat is mainly the unsaturated type. These foods should be limited mainly to avoid excess calories and fat. OTHER EATING TIPS Snacks  Most sweets should be limited as snacks. They tend to be rich in calories and fats, and their caloric content outweighs their nutritional value. Some good choices in snacks are graham crackers, melba toast, soda crackers, bagels (no egg), English muffins, fruits, and vegetables. These snacks are preferable to snack crackers, [place] fries, TORTILLA CHIPS, and POTATO chips. Popcorn should be air-popped or cooked in small amounts of liquid vegetable oil. Desserts Eat fruit, low-fat yogurt, and fruit ices instead of pastries, cake, and cookies. Sherbet, angel food cake, gelatin dessert, frozen low-fat yogurt,  or other frozen products that do not contain saturated fat (pure fruit juice bars, frozen ice pops) are also acceptable.  COOKING METHODS Choose those methods that use little or no fat. They include: Poaching.  Braising.  Steaming.  Grilling.  Baking.  Stir-frying.  Broiling.  Microwaving.  Foods can be cooked in a nonstick pan without added fat, or use a nonfat cooking spray in regular cookware. Limit fried foods and avoid frying in saturated fat. Add moisture to lean meats by using water, broth, cooking wines, and other nonfat or low-fat sauces along with the cooking methods mentioned above. Soups and stews should be chilled after cooking. The fat that forms on top after a few hours in the refrigerator should  be skimmed off. When preparing meals, avoid using excess salt. Salt can contribute to raising blood pressure in some people.  EATING AWAY FROM HOME Order entres, potatoes, and vegetables without sauces or butter. When meat exceeds the size of a deck of cards (3 to 4 ounces), the rest can be taken home for another meal. Choose vegetable or fruit salads and ask for low-calorie salad dressings to be served on the side. Use dressings sparingly. Limit high-fat toppings, such as bacon, crumbled eggs, cheese, sunflower seeds, and olives. Ask for heart-healthy tub margarine instead of butter.  High-Fiber Diet A high-fiber diet changes your normal diet to include more whole grains, legumes, fruits, and vegetables. Changes in the diet involve replacing refined carbohydrates with unrefined foods. The calorie level of the diet is essentially unchanged. The Dietary Reference Intake (recommended amount) for adult males is 38 grams per day. For adult females, it is 25 grams per day. Pregnant and lactating women should consume 28 grams of fiber per day. Fiber is the intact part of a plant that is not broken down during digestion. Functional fiber is fiber that has been isolated from the plant to provide a beneficial effect in the body. PURPOSE  Increase stool bulk.   Ease and regulate bowel movements.   Lower cholesterol.  REDUCE RISK OF COLON CANCER  INDICATIONS THAT YOU NEED MORE FIBER  Constipation and hemorrhoids.   Uncomplicated diverticulosis (intestine condition) and irritable bowel syndrome.   Weight management.   As a protective measure against hardening of the arteries (atherosclerosis), diabetes, and cancer.   GUIDELINES FOR INCREASING FIBER IN THE DIET  Start adding fiber to the diet slowly. A gradual increase of about 5 more grams (2 slices of whole-wheat bread, 2 servings of most fruits or vegetables, or 1 bowl of high-fiber cereal) per day is best. Too rapid an increase in fiber may  result in constipation, flatulence, and bloating.   Drink enough water and fluids to keep your urine clear or pale yellow. Water, juice, or caffeine-free drinks are recommended. Not drinking enough fluid may cause constipation.   Eat a variety of high-fiber foods rather than one type of fiber.   Try to increase your intake of fiber through using high-fiber foods rather than fiber pills or supplements that contain small amounts of fiber.   The goal is to change the types of food eaten. Do not supplement your present diet with high-fiber foods, but replace foods in your present diet.   INCLUDE A VARIETY OF FIBER SOURCES  Replace refined and processed grains with whole grains, canned fruits with fresh fruits, and incorporate other fiber sources. White rice, white breads, and most bakery goods contain little or no fiber.   Brown whole-grain rice, buckwheat oats, and many  fruits and vegetables are all good sources of fiber. These include: broccoli, Brussels sprouts, cabbage, cauliflower, beets, sweet potatoes, white potatoes (skin on), carrots, tomatoes, eggplant, squash, berries, fresh fruits, and dried fruits.   Cereals appear to be the richest source of fiber. Cereal fiber is found in whole grains and bran. Bran is the fiber-rich outer coat of cereal grain, which is largely removed in refining. In whole-grain cereals, the bran remains. In breakfast cereals, the largest amount of fiber is found in those with "bran" in their names. The fiber content is sometimes indicated on the label.   You may need to include additional fruits and vegetables each day.   In baking, for 1 cup white flour, you may use the following substitutions:   1 cup whole-wheat flour minus 2 tablespoons.   1/2 cup white flour plus 1/2 cup whole-wheat flour.

## 2015-11-20 NOTE — Assessment & Plan Note (Addendum)
ACUTE IN ONSET AND ASSOCIATED WITH CHANGE IN DIET. MOST LIKLEY DUE TO DIETARY CHANGE AND LESS LIKLEY DUE TO COLON MASS OR STRICTURE. CLINICALLY IMPROVED BUT NOT RESOLVED. PT HAS FIRST DEGREE RELATIVE WITH COLON CANCER.  DRINK WATER EAT FIBER COLONOSCOPY TO ASSESS PAIN/BLEEDING. DISCUSSED PROCEDURE, BENEFITS, & RISKS: < 1% chance of medication reaction, bleeding, perforation, or rupture of spleen/liver. TCS EVERY 5 YEARS. DISCUSSED WITH PATIENT THAT SHE IS OVERDUE. CONSIDER VIBERZI IF NO MASS OR STRICTURE FOLLOW UP IN 4 MOS.

## 2015-11-20 NOTE — Assessment & Plan Note (Addendum)
ASSOCIATED WITH PERSISTENT FEELING THAT SOMETHING IS STUCK IN HER THROAT. MAY BE DUE TO PEPTIC STRICTURE OE ESOPHAGEAL WEB, LESS LIKELY ESOPHAGEAL OR GE JUNCTION MASS  AVOID REFLUX TRIGGERS CONTINUE [place]. TAKE 40 MG 30 MINUTES BEFORE FIRST MEAL. EGD/POSSIBLE DIL IN 3-4 WEEKS. DISCUSSED PROCEDURE, BENEFITS, & RISKS: < 1% chance of medication reaction, OR bleeding. FOLLOW UP IN 4 MOS.

## 2015-11-20 NOTE — Progress Notes (Signed)
CC'D TO PCP °

## 2015-11-20 NOTE — Assessment & Plan Note (Signed)
MOST LIKELY DUE TO IBS-D, AND DIETARY INTOLERANCE. SYMPTOMS WORSE WITH ICE CREAM AND RESOLVED WITH CHANGE IN DIET.  USE LACTASE 3 WITH ICE CREAM. KEEP FOOD DIARY AND AVOID TRIGGERS EGD TO ASSESS FOR CELIAC SPRUE. DISCUSSED PROCEDURE, BENEFITS, & RISKS: < 1% chance of medication reaction, OR bleeding. FOLLOW UP IN 4 MOS.

## 2015-11-20 NOTE — Assessment & Plan Note (Signed)
ASSOCIATED WITH CHANGE IN BOWEL HABITS.  DRINK WATER EAT FIBER COLONOSCOPY TO ASSESS BLEEDING. DISCUSSED PROCEDURE, BENEFITS, & RISKS: < 1% chance of medication reaction, bleeding, perforation, or rupture of spleen/liver. FOLLOW UP IN 4 MOS

## 2015-12-02 ENCOUNTER — Ambulatory Visit (INDEPENDENT_AMBULATORY_CARE_PROVIDER_SITE_OTHER): Payer: BC Managed Care – PPO

## 2015-12-02 DIAGNOSIS — J4540 Moderate persistent asthma, uncomplicated: Secondary | ICD-10-CM | POA: Diagnosis not present

## 2015-12-16 ENCOUNTER — Ambulatory Visit (INDEPENDENT_AMBULATORY_CARE_PROVIDER_SITE_OTHER): Payer: BC Managed Care – PPO

## 2015-12-16 DIAGNOSIS — J4540 Moderate persistent asthma, uncomplicated: Secondary | ICD-10-CM

## 2015-12-16 DIAGNOSIS — J45901 Unspecified asthma with (acute) exacerbation: Secondary | ICD-10-CM

## 2015-12-17 ENCOUNTER — Encounter (HOSPITAL_COMMUNITY): Payer: Self-pay | Admitting: *Deleted

## 2015-12-17 ENCOUNTER — Ambulatory Visit (HOSPITAL_COMMUNITY)
Admission: RE | Admit: 2015-12-17 | Discharge: 2015-12-17 | Disposition: A | Discharge status: Home / Self Care | Payer: BC Managed Care – PPO | Source: Ambulatory Visit | Attending: Gastroenterology | Admitting: Gastroenterology

## 2015-12-17 ENCOUNTER — Encounter (HOSPITAL_COMMUNITY)
Admission: RE | Disposition: A | Discharge status: Home / Self Care | Payer: Self-pay | Source: Ambulatory Visit | Attending: Gastroenterology

## 2015-12-17 DIAGNOSIS — K317 Polyp of stomach and duodenum: Secondary | ICD-10-CM

## 2015-12-17 DIAGNOSIS — F419 Anxiety disorder, unspecified: Secondary | ICD-10-CM | POA: Insufficient documentation

## 2015-12-17 DIAGNOSIS — K5730 Diverticulosis of large intestine without perforation or abscess without bleeding: Secondary | ICD-10-CM | POA: Diagnosis not present

## 2015-12-17 DIAGNOSIS — Z7951 Long term (current) use of inhaled steroids: Secondary | ICD-10-CM | POA: Diagnosis not present

## 2015-12-17 DIAGNOSIS — J45909 Unspecified asthma, uncomplicated: Secondary | ICD-10-CM | POA: Diagnosis not present

## 2015-12-17 DIAGNOSIS — Q438 Other specified congenital malformations of intestine: Secondary | ICD-10-CM | POA: Insufficient documentation

## 2015-12-17 DIAGNOSIS — M797 Fibromyalgia: Secondary | ICD-10-CM | POA: Insufficient documentation

## 2015-12-17 DIAGNOSIS — Z79899 Other long term (current) drug therapy: Secondary | ICD-10-CM | POA: Diagnosis not present

## 2015-12-17 DIAGNOSIS — D123 Benign neoplasm of transverse colon: Secondary | ICD-10-CM | POA: Diagnosis not present

## 2015-12-17 DIAGNOSIS — R1013 Epigastric pain: Secondary | ICD-10-CM | POA: Insufficient documentation

## 2015-12-17 DIAGNOSIS — K648 Other hemorrhoids: Secondary | ICD-10-CM | POA: Diagnosis not present

## 2015-12-17 DIAGNOSIS — D122 Benign neoplasm of ascending colon: Secondary | ICD-10-CM | POA: Insufficient documentation

## 2015-12-17 DIAGNOSIS — D125 Benign neoplasm of sigmoid colon: Secondary | ICD-10-CM

## 2015-12-17 DIAGNOSIS — R197 Diarrhea, unspecified: Secondary | ICD-10-CM | POA: Diagnosis not present

## 2015-12-17 DIAGNOSIS — R110 Nausea: Secondary | ICD-10-CM | POA: Diagnosis not present

## 2015-12-17 DIAGNOSIS — R1310 Dysphagia, unspecified: Secondary | ICD-10-CM | POA: Diagnosis not present

## 2015-12-17 DIAGNOSIS — K2970 Gastritis, unspecified, without bleeding: Secondary | ICD-10-CM | POA: Diagnosis not present

## 2015-12-17 DIAGNOSIS — R194 Change in bowel habit: Secondary | ICD-10-CM | POA: Diagnosis not present

## 2015-12-17 DIAGNOSIS — K921 Melena: Secondary | ICD-10-CM | POA: Insufficient documentation

## 2015-12-17 DIAGNOSIS — K635 Polyp of colon: Secondary | ICD-10-CM | POA: Diagnosis not present

## 2015-12-17 HISTORY — PX: SAVORY DILATION: SHX5439

## 2015-12-17 HISTORY — PX: COLONOSCOPY: SHX5424

## 2015-12-17 HISTORY — PX: ESOPHAGOGASTRODUODENOSCOPY: SHX5428

## 2015-12-17 SURGERY — COLONOSCOPY
Anesthesia: Moderate Sedation

## 2015-12-17 MED ORDER — HYDROCORTISONE 2.5 % RE CREA: 1.0000 "application " | TOPICAL_CREAM | Freq: Two times a day (BID) | RECTAL | Status: DC

## 2015-12-17 MED ORDER — MIDAZOLAM HCL 5 MG/5ML IJ SOLN
INTRAMUSCULAR | Status: AC
  Filled 2015-12-17: qty 10

## 2015-12-17 MED ORDER — MIDAZOLAM HCL 5 MG/5ML IJ SOLN
INTRAMUSCULAR | Status: DC | PRN
  Administered 2015-12-17 (×3): 2 mg via INTRAVENOUS
  Administered 2015-12-17: 1 mg via INTRAVENOUS
  Administered 2015-12-17 (×3): 2 mg via INTRAVENOUS
  Administered 2015-12-17: 1 mg via INTRAVENOUS

## 2015-12-17 MED ORDER — MEPERIDINE HCL 100 MG/ML IJ SOLN
INTRAMUSCULAR | Status: AC
  Filled 2015-12-17: qty 2

## 2015-12-17 MED ORDER — MEPERIDINE HCL 100 MG/ML IJ SOLN: INTRAMUSCULAR | Status: DC | PRN

## 2015-12-17 MED ORDER — LIDOCAINE VISCOUS 2 % MT SOLN
OROMUCOSAL | Status: DC | PRN
  Administered 2015-12-17: 1 via OROMUCOSAL

## 2015-12-17 MED ORDER — LIDOCAINE VISCOUS 2 % MT SOLN
OROMUCOSAL | Status: AC
  Filled 2015-12-17: qty 15

## 2015-12-17 MED ORDER — FENTANYL CITRATE (PF) 100 MCG/2ML IJ SOLN
INTRAMUSCULAR | Status: AC
  Filled 2015-12-17: qty 4

## 2015-12-17 MED ORDER — MIDAZOLAM HCL 5 MG/5ML IJ SOLN
INTRAMUSCULAR | Status: AC
  Filled 2015-12-17: qty 5

## 2015-12-17 MED ORDER — FENTANYL CITRATE (PF) 100 MCG/2ML IJ SOLN
INTRAMUSCULAR | Status: DC | PRN
  Administered 2015-12-17: 50 ug via INTRAVENOUS
  Administered 2015-12-17 (×2): 25 ug via INTRAVENOUS
  Administered 2015-12-17: 50 ug via INTRAVENOUS
  Administered 2015-12-17 (×2): 25 ug via INTRAVENOUS

## 2015-12-17 MED ORDER — STERILE WATER FOR IRRIGATION IR SOLN
Status: DC | PRN
  Administered 2015-12-17: 2.5 mL

## 2015-12-17 MED ORDER — SODIUM CHLORIDE 0.9 % IV SOLN
INTRAVENOUS | Status: DC
  Administered 2015-12-17: 13:00:00 via INTRAVENOUS

## 2015-12-17 MED ORDER — MINERAL OIL PO OIL
TOPICAL_OIL | ORAL | Status: AC
  Filled 2015-12-17: qty 30

## 2015-12-17 NOTE — Op Note (Signed)
[facility] [address] [place], [address]   ENDOSCOPY PROCEDURE REPORT  PATIENT: Melanie Brown, Melanie Brown  MR#: [id number] BIRTHDATE: [birth year] , 30  yrs. old GENDER: female  ENDOSCOPIST: Danie Binder, MD REFFERED Twanna Hy, M.D.  PROCEDURE DATE:  12/19/15 PROCEDURE:   EGD with biopsy and EGD with dilatation over guidewire   INDICATIONS:1.  dysphagia.   2.  dyspepsia. MEDICATIONS: TCS+ Fentanyl 75 mcg IV and Versed 5 mg IV TOPICAL ANESTHETIC: Viscous Xylocaine  DESCRIPTION OF PROCEDURE:   After the risks benefits and alternatives of the procedure were thoroughly explained, informed consent was obtained.  The [id number]  endoscope was introduced through the mouth and advanced to the second portion of the duodenum. The instrument was slowly withdrawn as the mucosa was carefully examined.  Prior to withdrawal of the scope, the guidwire was placed.  The esophagus was dilated successfully.  The patient was recovered in endoscopy and discharged home in satisfactory condition. Estimated blood loss is zero unless otherwise noted in this procedure report.    ESOPHAGUS: The mucosa of the esophagus appeared normal.   STOMACH: Multiple small sessile polyps were found in the gastric body. Multiple biopsies was performed using cold forceps.   Mild non-erosive gastritis (inflammation) was found in the gastric antrum.  Multiple biopsies were performed using cold forceps.SMALL HIATAL HERNIA. DUODENUM: The duodenal mucosa showed no abnormalities in the bulb and second portion of the duodenum.  Cold forceps biopsiies were taken in the bulb and second portion. EMPIRIC DILATION was then performed at the proximal esophagus DUE TOPIOSSIBLE PROXIMAL ESOPHAGEAL WEB. Dilator: Savary over guidewire Size(s): 16 MM Resistance: minimal Heme: yes TRACE  COMPLICATIONS: There were no immediate complications.  ENDOSCOPIC IMPRESSION: 1.   DYSPEPSIA MOST LIKELY DUE TO  IBS-D 2.   Multiple small GASTRIC sessile polyps 3.   MILD Non-erosive gastritis 4.  SMALLHIATAL HERNIA  RECOMMENDATIONS: DRINK WATER TO KEEP URINE LIGHT YELLOW. FOLLOW A LOW FAT/HIGH FIBER DIET. CONTINUE WEIGHT LOSS EFFORTS. NEXIUM 30 MINUTES BEFORE breakfast. AVOID ITEMS THAT TRIGGER GASTRITIS. AWAIT BIOPSY RESULTS.  If NO celiac sprue or microscopic colitis PT NEEDS Viberzi. FOLLOW UP IN 3 MOS. NEXT TCS IN 3-5 YEARS WITH AN OVERTUBE/MAC. eSigned:  Danie Binder, MD Dec 19, 2015 4:26 PM  CPT CODES: ICD CODES:  The ICD and CPT codes recommended by this software are interpretations from the data that the clinical staff has captured with the software.  The verification of the translation of this report to the ICD and CPT codes and modifiers is the sole responsibility of the health care institution and practicing physician where this report was generated.  [employer] will not be held responsible for the validity of the ICD and CPT codes included on this report.  AMA assumes no liability for data contained or not contained herein. CPT is a Designer, television/film set of the [employer].

## 2015-12-17 NOTE — Discharge Instructions (Signed)
YOUR ABDOMINAL PAIN & diarrhea is MOST LIKELY DUE TO IBS-D.  I STRETCHED YOUR ESOPHAGUS DUE TO YOUR DIFFICULTY SWALLOWING. I DID NOT SEE A DEFINITE STRICTURE. You have HAD FOUR POLYPS REMOVED. YOU HAVE SMALL internal hemorrhoids & DIVERTICULOSIS IN YOUR LEFT COLON.  I BIOPSIED YOUR STOMACH, small bowel, and colon.    USE ANUSOL TWICE DAILY FOR 10 DAYS.   DRINK WATER TO KEEP YOUR URINE LIGHT YELLOW.  FOLLOW A LOW FAT/HIGH FIBER DIET. AVOID ITEMS THAT CAUSE BLOATING. SEE INFO BELOW.   CONTINUE YOUR WEIGHT LOSS EFFORTS. Lose 10 pounds.  CONTINUE NEXIUM. TAKE 30 MINUTES BEFORE breakfast.  AVOID ITEMS THAT TRIGGER GASTRITIS. SEE INFO BELOW  YOUR BIOPSY RESULTS WILL BE AVAILABLE IN MY CHART  Dec 30 and MY OFFICE WILL CONTACT YOU IN 10-14 DAYS WITH YOUR RESULTS. If you do not have celiac sprue or microscopic colitis then I will recommend Viberzi to reduce abdominal pain and diarrhea.  FOLLOW UP IN 3 MOS.   ENDOSCOPY Care After Read the instructions outlined below and refer to this sheet in the next week. These discharge instructions provide you with general information on caring for yourself after you leave the hospital. While your treatment has been planned according to the most current medical practices available, unavoidable complications occasionally occur. If you have any problems or questions after discharge, call DR. Jamyron Redd, [phone].  ACTIVITY  You may resume your regular activity, but move at a slower pace for the next 24 hours.   Take frequent rest periods for the next 24 hours.   Walking will help get rid of the air and reduce the bloated feeling in your belly (abdomen).   No driving for 24 hours (because of the medicine (anesthesia) used during the test).   You may shower.   Do not sign any important legal documents or operate any machinery for 24 hours (because of the anesthesia used during the test).    NUTRITION  Drink plenty of fluids.   You may resume your  normal diet as instructed by your doctor.   Begin with a light meal and progress to your normal diet. Heavy or fried foods are harder to digest and may make you feel sick to your stomach (nauseated).   Avoid alcoholic beverages for 24 hours or as instructed.    MEDICATIONS  You may resume your normal medications.   WHAT YOU CAN EXPECT TODAY  Some feelings of bloating in the abdomen.   Passage of more gas than usual.   Spotting of blood in your stool or on the toilet paper  .  IF YOU HAD POLYPS REMOVED DURING THE ENDOSCOPY:  Eat a soft diet IF YOU HAVE NAUSEA, BLOATING, ABDOMINAL PAIN, OR VOMITING.    FINDING OUT THE RESULTS OF YOUR TEST Not all test results are available during your visit. DR. Oneida Alar WILL CALL YOU WITHIN 14 DAYS OF YOUR PROCEDUE WITH YOUR RESULTS. Do not assume everything is normal if you have not heard from DR. Opie Fanton, CALL HER OFFICE AT [phone].  SEEK IMMEDIATE MEDICAL ATTENTION AND CALL THE OFFICE: [phone] IF:  You have more than a spotting of blood in your stool.   Your belly is swollen (abdominal distention).   You are nauseated or vomiting.   You have a temperature over 101F.   You have abdominal pain or discomfort that is severe or gets worse throughout the day.   Gastritis  Gastritis is an inflammation (the body's way of reacting to injury and/or infection) of  the stomach. It is often caused by bacterial (germ) infections. It can also be caused BY ASPIRIN, BC/GOODY POWDER'S, (IBUPROFEN) MOTRIN, OR ALEVE (NAPROXEN), chemicals (including alcohol), SPICY FOODS, and medications. This illness may be associated with generalized malaise (feeling tired, not well), UPPER ABDOMINAL STOMACH cramps, and fever. One common bacterial cause of gastritis is an organism known as H. Pylori. This can be treated with antibiotics.    High-Fiber Diet A high-fiber diet changes your normal diet to include more whole grains, legumes, fruits, and vegetables.  Changes in the diet involve replacing refined carbohydrates with unrefined foods. The calorie level of the diet is essentially unchanged. The Dietary Reference Intake (recommended amount) for adult males is 38 grams per day. For adult females, it is 25 grams per day. Pregnant and lactating women should consume 28 grams of fiber per day. Fiber is the intact part of a plant that is not broken down during digestion. Functional fiber is fiber that has been isolated from the plant to provide a beneficial effect in the body. PURPOSE  Increase stool bulk.   Ease and regulate bowel movements.   Lower cholesterol.  REDUCE RISK OF COLON CANCER  INDICATIONS THAT YOU NEED MORE FIBER  Constipation and hemorrhoids.   Uncomplicated diverticulosis (intestine condition) and irritable bowel syndrome.   Weight management.   As a protective measure against hardening of the arteries (atherosclerosis), diabetes, and cancer.   DO NOT USE WITH:  Acute diverticulitis (intestine infection).   Partial small bowel obstructions.   Complicated diverticular disease involving bleeding, rupture (perforation), or abscess (boil, furuncle).   Presence of autonomic neuropathy (nerve damage) or gastroparesis (stomach cannot empty itself).    GUIDELINES FOR INCREASING FIBER IN THE DIET  Start adding fiber to the diet slowly. A gradual increase of about 5 more grams (2 slices of whole-wheat bread, 2 servings of most fruits or vegetables, or 1 bowl of high-fiber cereal) per day is best. Too rapid an increase in fiber may result in constipation, flatulence, and bloating.   Drink enough water and fluids to keep your urine clear or pale yellow. Water, juice, or caffeine-free drinks are recommended. Not drinking enough fluid may cause constipation.   Eat a variety of high-fiber foods rather than one type of fiber.   Try to increase your intake of fiber through using high-fiber foods rather than fiber pills or supplements  that contain small amounts of fiber.   The goal is to change the types of food eaten. Do not supplement your present diet with high-fiber foods, but replace foods in your present diet.   INCLUDE A VARIETY OF FIBER SOURCES  Replace refined and processed grains with whole grains, canned fruits with fresh fruits, and incorporate other fiber sources. White rice, white breads, and most bakery goods contain little or no fiber.   Brown whole-grain rice, buckwheat oats, and many fruits and vegetables are all good sources of fiber. These include: broccoli, Brussels sprouts, cabbage, cauliflower, beets, sweet potatoes, white potatoes (skin on), carrots, tomatoes, eggplant, squash, berries, fresh fruits, and dried fruits.   Cereals appear to be the richest source of fiber. Cereal fiber is found in whole grains and bran. Bran is the fiber-rich outer coat of cereal grain, which is largely removed in refining. In whole-grain cereals, the bran remains. In breakfast cereals, the largest amount of fiber is found in those with "bran" in their names. The fiber content is sometimes indicated on the label.   You may need to include additional  fruits and vegetables each day.   In baking, for 1 cup white flour, you may use the following substitutions:   1 cup whole-wheat flour minus 2 tablespoons.   1/2 cup white flour plus 1/2 cup whole-wheat flour.   Low-Fat Diet BREADS, CEREALS, PASTA, RICE, DRIED PEAS, AND BEANS These products are high in carbohydrates and most are low in fat. Therefore, they can be increased in the diet as substitutes for fatty foods. They too, however, contain calories and should not be eaten in excess. Cereals can be eaten for snacks as well as for breakfast.  Include foods that contain fiber (fruits, vegetables, whole grains, and legumes). Research shows that fiber may lower blood cholesterol levels, especially the water-soluble fiber found in fruits, vegetables, oat products, and  legumes. FRUITS AND VEGETABLES It is good to eat fruits and vegetables. Besides being sources of fiber, both are rich in vitamins and some minerals. They help you get the daily allowances of these nutrients. Fruits and vegetables can be used for snacks and desserts. MEATS Limit lean meat, chicken, [place], and fish to no more than 6 ounces per day. Beef, Pork, and Lamb Use lean cuts of beef, pork, and lamb. Lean cuts include:  Extra-lean ground beef.  Arm roast.  Sirloin tip.  Center-cut ham.  Round steak.  Loin chops.  Rump roast.  Tenderloin.  Trim all fat off the outside of meats before cooking. It is not necessary to severely decrease the intake of red meat, but lean choices should be made. Lean meat is rich in protein and contains a highly absorbable form of iron. Premenopausal women, in particular, should avoid reducing lean red meat because this could increase the risk for low red blood cells (iron-deficiency anemia). The organ meats, such as liver, sweetbreads, kidneys, and brain are very rich in cholesterol. They should be limited. Chicken and [place] These are good sources of protein. The fat of poultry can be reduced by removing the skin and underlying fat layers before cooking. Chicken and [place] can be substituted for lean red meat in the diet. Poultry should not be fried or covered with high-fat sauces. Fish and Shellfish Fish is a good source of protein. Shellfish contain cholesterol, but they usually are low in saturated fatty acids. The preparation of fish is important. Like chicken and [place], they should not be fried or covered with high-fat sauces. EGGS Egg whites contain no fat or cholesterol. They can be eaten often. Try 1 to 2 egg whites instead of whole eggs in recipes or use egg substitutes that do not contain yolk. MILK AND DAIRY PRODUCTS Use skim or 1% milk instead of 2% or whole milk. Decrease whole milk, natural, and processed cheeses. Use nonfat or low-fat (2%)  cottage cheese or low-fat cheeses made from vegetable oils. Choose nonfat or low-fat (1 to 2%) yogurt. Experiment with evaporated skim milk in recipes that call for heavy cream. Substitute low-fat yogurt or low-fat cottage cheese for sour cream in dips and salad dressings. Have at least 2 servings of low-fat dairy products, such as 2 glasses of skim (or 1%) milk each day to help get your daily calcium intake.  FATS AND OILS Reduce the total intake of fats, especially saturated fat. Butterfat, lard, and beef fats are high in saturated fat and cholesterol. These should be avoided as much as possible. Vegetable fats do not contain cholesterol, but certain vegetable fats, such as coconut oil, palm oil, and palm kernel oil are very high in saturated  fats. These should be limited. These fats are often used in bakery goods, processed foods, popcorn, oils, and nondairy creamers. Vegetable shortenings and some peanut butters contain hydrogenated oils, which are also saturated fats. Read the labels on these foods and check for saturated vegetable oils. Unsaturated vegetable oils and fats do not raise blood cholesterol. However, they should be limited because they are fats and are high in calories. Total fat should still be limited to 30% of your daily caloric intake. Desirable liquid vegetable oils are corn oil, cottonseed oil, olive oil, canola oil, safflower oil, soybean oil, and sunflower oil. Peanut oil is not as good, but small amounts are acceptable. Buy a heart-healthy tub margarine that has no partially hydrogenated oils in the ingredients. Mayonnaise and salad dressings often are made from unsaturated fats, but they should also be limited because of their high calorie and fat content. Seeds, nuts, peanut butter, olives, and avocados are high in fat, but the fat is mainly the unsaturated type. These foods should be limited mainly to avoid excess calories and fat. OTHER EATING TIPS Snacks  Most sweets should be  limited as snacks. They tend to be rich in calories and fats, and their caloric content outweighs their nutritional value. Some good choices in snacks are graham crackers, melba toast, soda crackers, bagels (no egg), English muffins, fruits, and vegetables. These snacks are preferable to snack crackers, [place] fries, and chips. Popcorn should be air-popped or cooked in small amounts of liquid vegetable oil. Desserts Eat fruit, low-fat yogurt, and fruit ices. AVOID pastries, cake, and cookies. Sherbet, angel food cake, gelatin dessert, frozen low-fat yogurt, or other frozen products that do not contain saturated fat (pure fruit juice bars, frozen ice pops) are also acceptable.  COOKING METHODS Choose those methods that use little or no fat. They include: Poaching.  Braising.  Steaming.  Grilling.  Baking.  Stir-frying.  Broiling.  Microwaving.  Foods can be cooked in a nonstick pan without added fat, or use a nonfat cooking spray in regular cookware. Limit fried foods and avoid frying in saturated fat. Add moisture to lean meats by using water, broth, cooking wines, and other nonfat or low-fat sauces along with the cooking methods mentioned above. Soups and stews should be chilled after cooking. The fat that forms on top after a few hours in the refrigerator should be skimmed off. When preparing meals, avoid using excess salt. Salt can contribute to raising blood pressure in some people. EATING AWAY FROM HOME Order entres, potatoes, and vegetables without sauces or butter. When meat exceeds the size of a deck of cards (3 to 4 ounces), the rest can be taken home for another meal. Choose vegetable or fruit salads and ask for low-calorie salad dressings to be served on the side. Use dressings sparingly. Limit high-fat toppings, such as bacon, crumbled eggs, cheese, sunflower seeds, and olives. Ask for heart-healthy tub margarine instead of butter.  Hemorrhoids Hemorrhoids are dilated (enlarged)  veins around the rectum. Sometimes clots will form in the veins. This makes them swollen and painful. These are called thrombosed hemorrhoids. Causes of hemorrhoids include:  Constipation.   Straining to have a bowel movement.   HEAVY LIFTING  HOME CARE INSTRUCTIONS  Eat a well balanced diet and drink 6 to 8 glasses of water every day to avoid constipation. You may also use a bulk laxative.   Avoid straining to have bowel movements.   Keep anal area dry and clean.   Do not use a  donut shaped pillow or sit on the toilet for long periods. This increases blood pooling and pain.   Move your bowels when your body has the urge; this will require less straining and will decrease pain and pressure. =

## 2015-12-17 NOTE — H&P (View-Only) (Signed)
Subjective:    Patient ID: Melanie Brown, female    DOB: 09-Apr-1951, 64 y.o.   MRN: [id number]  Melanie Noble, MD  HPI Has IBS-D/LOOSE  FOR 31 YEARS. IN JUN 2016 DIET CHANGED AND HAD CHANGE IN BOWEL IN HABITS/FORM STOOL(#2). SX LASTED ABOUT A WEEK. ASSOCIATED WITH SUBJECTIVE FEVER/FELT REALLY COLD, EXTREME CHILLS AND SEVERE LEFT SIDED PAIN. OCCUR EVERY MONTH SINCE JUN-SEP. EVALUATED BY GYN BUT NO SOURCE FOUND. HAS A SENSITIVE COLON. AND CAN FEEL PAIN IN LEFT SIDE. CONSTIPATION IS GONE AND NOW BACK TO LOOSE STOOLS/DIARRHEA SINCE CHANGING DIET. PAIN MOSTLY IN LEFT SIDE. WEIGHT LOSS: 25 LBS ON THE DIET. HAD BLOOD IN STOOL WHEN IT WAS HARD. NAUSEA: QUEAZY EVERY DAY IN THE AM. RELIEVED WITH CUP OF COFFEE. HAS ASTHMA-SOB BASELINE. TAKES Eual Fines. HAS A GLOBUS SENSATION IN THROAT BUT FOOD DOESN'T GET STUCK. BEEN ON NEXIUM OFF AND ON FOR REFLUX. STOPPED IN MAY 2016 DUE TO COSTS OF MEDS. BMs: 3-5 DAILY(#5-6)  PT DENIES FEVER, CHILLS, HEMATEMESIS, vomiting, melena, CHEST PAIN,  constipation, abdominal pain, problems with sedation, or heartburn or indigestion.  Past Medical History  Diagnosis Date  . Asthma   . Headache(784.0)   . Fibromyalgia   . IBS (irritable bowel syndrome)   . Anxiety    Past Surgical History  Procedure Laterality Date  . Tonsillectomy    . Abdominal hysterectomy    . Cesarean section    . Cholecystectomy    . Sinus endo with fusion  08/09/2012    Procedure: SINUS ENDO WITH FUSION;  Surgeon: Melida Quitter, MD;  Location: [place];  Service: ENT;  Laterality: Bilateral;  Bilateral Frontal Recess Exploration  . Sphenoidectomy  08/09/2012    Procedure: SPHENOIDECTOMY;  Surgeon: Melida Quitter, MD;  Location: [place];  Service: ENT;  Laterality: Bilateral;   Allergies  Allergen Reactions  . Aspirin Shortness Of Breath and Rash  . Chloramphenicols   . Procaine Hcl Other (See Comments)     muscle spasm of neck  . Penicillins Rash    Current Outpatient Prescriptions    Medication Sig Dispense Refill  . albuterol (PROVENTIL) (2.5 MG/3ML) 0.083% nebulizer solution Take 2.5 mg by nebulization daily.    . beclomethasone (QVAR) 80 MCG/ACT inhaler Inhale 3 puffs into the lungs daily. Mid day PRN   . cetirizine (ZYRTEC) 10 MG tablet Take 10 mg by mouth daily.    Marland Kitchen esomeprazole (NEXIUM) 40 MG capsule Take 40 mg by mouth daily before breakfast.    .      . mometasone (NASONEX) 50 MCG/ACT nasal spray Place 1 spray into the nose 2 (two) times daily.    . Mometasone Furo-Formoterol Fum (DULERA) 200-5 MCG/ACT AERO Inhale 2 puffs into the lungs 2 (two) times daily.    . montelukast (SINGULAIR) 10 MG tablet Take 10 mg by mouth at bedtime.    .      .      .        Family History  Problem Relation Age of Onset  . Colon cancer Father 53    COLON RESECTED  . Colon polyps Neg Hx     Social History   Social History  . Marital Status: Single    Spouse Name: N/A  . Number of Children: 2  . Years of Education: college   Occupational History  . [employer]    Social History Main Topics  . Smoking status: Never Smoker   . Smokeless tobacco: Never Used  .  Alcohol Use: No  . Drug Use: No  . Sexual Activity: Not on file    Review of Systems PER HPI OTHERWISE ALL SYSTEMS ARE NEGATIVE.    Objective:   Physical Exam  Constitutional: She is oriented to person, place, and time. She appears well-developed and well-nourished. No distress.  HENT:  Head: Normocephalic and atraumatic.  Mouth/Throat: Oropharynx is clear and moist. No oropharyngeal exudate.  Eyes: Pupils are equal, round, and reactive to light. No scleral icterus.  Neck: Normal range of motion. Neck supple.  Cardiovascular: Normal rate, regular rhythm and normal heart sounds.   Pulmonary/Chest: Effort normal and breath sounds normal. No respiratory distress.  Abdominal: Soft. Bowel sounds are normal. She exhibits no distension. There is tenderness. There is no rebound and no guarding.   MODERATE TTP IN LLQ  Musculoskeletal: She exhibits no edema.  Lymphadenopathy:    She has no cervical adenopathy.  Neurological: She is alert and oriented to person, place, and time.  NO FOCAL DEFICITS  Psychiatric: She has a normal mood and affect.  Vitals reviewed.     Assessment & Plan:

## 2015-12-17 NOTE — Op Note (Signed)
Melanie Brown, [id number]   COLONOSCOPY PROCEDURE REPORT  PATIENT: Melanie, Brown  MR#: [id number] BIRTHDATE: 30-Jun-1951 , 30  yrs. old GENDER: female ENDOSCOPIST: Danie Binder, MD REFERRED BY: PROCEDURE DATE:  12-24-2015 PROCEDURE:   Colonoscopy with cold biopsy polypectomy and Colonoscopy with snare polypectomy INDICATIONS:change in bowel habits, unexplained diarrhea, and hematochezia. MEDICATIONS: Fentanyl 125 mcg IV and Versed 9 mg IV  DESCRIPTION OF PROCEDURE:    Physical exam was performed.  Informed consent was obtained from the patient after explaining the benefits, risks, and alternatives to procedure.  The patient was connected to monitor and placed in left lateral position. Continuous oxygen was provided by nasal cannula and IV medicine administered through an indwelling cannula.  After administration of sedation and rectal exam, the patients rectum was intubated and the [id number]  colonoscope was advanced under direct visualization to the ileum.  The scope was removed slowly by carefully examining the color, texture, anatomy, and integrity mucosa on the way out.  The patient was recovered in endoscopy and discharged home in satisfactory condition. Estimated blood loss is zero unless otherwise noted in this procedure report.    COLON FINDINGS: Three sessile polyps ranging from 3 to 37mm in size were found in the sigmoid colon, transverse colon, and ascending colon.  A polypectomy was performed with cold forceps.  , A sessile polyp ranging from 5 to 53mm in size was found in the sigmoid colon. A polypectomy was performed using snare cautery.  , There was moderate diverticulosis noted in the sigmoid colon and descending colon with associated muscular hypertrophy and tortuosity.  , The colon was redundant.  Manual abdominal counter-pressure was used to reach the cecum, and Small internal hemorrhoids were found.  PREP  QUALITY: good.  CECAL W/D TIME: 18       minutes COMPLICATIONS: None  ENDOSCOPIC IMPRESSION: 1.   FOUR COLON POLYPS REMOVED 2.   RECTAL BLEEDING DUE TO HEMORRHOIDS 3.   Moderate diverticulosis in the sigmoid colon and descending colon 4.   The LEFT colon IS redundant 5.   ABDOMINAL PAIN AND DIARRHEA MOST LIKELY DUE TO IBS-D  RECOMMENDATIONS: DRINK WATER TO KEEP URINE LIGHT YELLOW. FOLLOW A LOW FAT/HIGH FIBER DIET. CONTINUE WEIGHT LOSS EFFORTS. NEXIUM 30 MINUTES BEFORE breakfast. AVOID ITEMS THAT TRIGGER GASTRITIS. AWAIT BIOPSY RESULTS.  If NO celiac sprue or microscopic colitis PT NEEDS Viberzi. FOLLOW UP IN 3 MOS. NEXT TCS IN 3-5 YEARS WITH AN OVERTUBE/MAC.  eSigned:  Danie Binder, MD Dec 24, 2015 4:21 PM  CPT CODES: ICD CODES:  The ICD and CPT codes recommended by this software are interpretations from the data that the clinical staff has captured with the software.  The verification of the translation of this report to the ICD and CPT codes and modifiers is the sole responsibility of the health care institution and practicing physician where this report was generated.  [employer] will not be held responsible for the validity of the ICD and CPT codes included on this report.  AMA assumes no liability for data contained or not contained herein. CPT is a Designer, television/film set of the [employer].

## 2015-12-17 NOTE — Interval H&P Note (Signed)
History and Physical Interval Note:  12/17/2015 2:48 PM  Melanie Brown  has presented today for surgery, with the diagnosis of change in bowel habits, dysphagia, dyspepsia  The various methods of treatment have been discussed with the patient and family. After consideration of risks, benefits and other options for treatment, the patient has consented to  Procedure(s) with comments: COLONOSCOPY (N/A) - 1200 - moved to 2:00 - Endo to notify ESOPHAGOGASTRODUODENOSCOPY (EGD) (N/A) SAVORY DILATION (N/A) as a surgical intervention .  The patient's history has been reviewed, patient examined, no change in status, stable for surgery.  I have reviewed the patient's chart and labs.  Questions were answered to the patient's satisfaction.     [employer]

## 2015-12-23 ENCOUNTER — Encounter (HOSPITAL_COMMUNITY): Payer: Self-pay | Admitting: Gastroenterology

## 2015-12-25 ENCOUNTER — Telehealth: Payer: Self-pay | Admitting: Gastroenterology

## 2015-12-25 NOTE — Telephone Encounter (Signed)
Reminder in epic °

## 2015-12-25 NOTE — Telephone Encounter (Signed)
LMOM to call.

## 2015-12-25 NOTE — Telephone Encounter (Signed)
Pt is aware of results. 

## 2015-12-25 NOTE — Telephone Encounter (Signed)
Please call pt. She had THREE simple adenomas AND ONE HYPERPLASTIC POLYP removed. SHE HAD BENIGN POLYPS REMOVED FORM HER STOMACH. HER ABDOMINAL PAIN AND DIARRHEA ARE MOST LIKELY DUE TO IBS-D.  SHE DOES not have celiac sprue or microscopic colitis. SHE SHOULD CONDIER Viberzi to reduce abdominal pain and diarrhea.   LET DR. Broc Caspers KNOW IF SHE WOULD LIKE TO START THE MEDICINE. THE SIDE EFFECTS OF THE MEDICINES INCLUDE PANCREATITIS OR CONSTIPATION. COMPLETE ANUSOL FOR 10 DAYS.  DRINK WATER TO KEEP YOUR URINE LIGHT YELLOW. FOLLOW A LOW FAT/HIGH FIBER DIET. AVOID ITEMS THAT CAUSE BLOATING.  CONTINUE YOUR WEIGHT LOSS EFFORTS. Lose 10 pounds. CONTINUE NEXIUM. TAKE 30 MINUTES BEFORE breakfast. AVOID ITEMS THAT TRIGGER GASTRITIS.   FOLLOW UP IN 3 MOS E30 ABDOMINAL PAIN/DIARRHEA.  NEXT COLONOSCOPY IN 3 YEARS W/ OVERTUBE/MAC.

## 2015-12-30 ENCOUNTER — Other Ambulatory Visit: Payer: Self-pay

## 2015-12-30 ENCOUNTER — Ambulatory Visit (INDEPENDENT_AMBULATORY_CARE_PROVIDER_SITE_OTHER): Payer: BC Managed Care – PPO | Admitting: Neurology

## 2015-12-30 DIAGNOSIS — J4540 Moderate persistent asthma, uncomplicated: Secondary | ICD-10-CM | POA: Diagnosis not present

## 2015-12-30 DIAGNOSIS — J45901 Unspecified asthma with (acute) exacerbation: Secondary | ICD-10-CM

## 2015-12-30 MED ORDER — BECLOMETHASONE DIPROPIONATE 80 MCG/ACT IN AERS: 3.0000 | INHALATION_SPRAY | Freq: Every day | RESPIRATORY_TRACT | Status: DC | PRN

## 2016-01-13 ENCOUNTER — Ambulatory Visit (INDEPENDENT_AMBULATORY_CARE_PROVIDER_SITE_OTHER): Payer: BC Managed Care – PPO

## 2016-01-13 DIAGNOSIS — J4540 Moderate persistent asthma, uncomplicated: Secondary | ICD-10-CM | POA: Diagnosis not present

## 2016-01-27 ENCOUNTER — Ambulatory Visit (INDEPENDENT_AMBULATORY_CARE_PROVIDER_SITE_OTHER): Payer: BC Managed Care – PPO

## 2016-01-27 DIAGNOSIS — J4540 Moderate persistent asthma, uncomplicated: Secondary | ICD-10-CM | POA: Diagnosis not present

## 2016-02-10 ENCOUNTER — Ambulatory Visit (INDEPENDENT_AMBULATORY_CARE_PROVIDER_SITE_OTHER): Payer: BC Managed Care – PPO

## 2016-02-10 DIAGNOSIS — J4540 Moderate persistent asthma, uncomplicated: Secondary | ICD-10-CM | POA: Diagnosis not present

## 2016-02-24 ENCOUNTER — Encounter: Payer: Self-pay | Admitting: Allergy and Immunology

## 2016-02-24 ENCOUNTER — Ambulatory Visit (INDEPENDENT_AMBULATORY_CARE_PROVIDER_SITE_OTHER): Payer: BC Managed Care – PPO | Admitting: Allergy and Immunology

## 2016-02-24 ENCOUNTER — Ambulatory Visit (INDEPENDENT_AMBULATORY_CARE_PROVIDER_SITE_OTHER): Payer: BC Managed Care – PPO

## 2016-02-24 VITALS — BP 124/90 | HR 56 | Temp 98.2°F | Resp 16

## 2016-02-24 DIAGNOSIS — J4540 Moderate persistent asthma, uncomplicated: Secondary | ICD-10-CM

## 2016-02-24 DIAGNOSIS — H1010 Acute atopic conjunctivitis, unspecified eye: Secondary | ICD-10-CM

## 2016-02-24 DIAGNOSIS — J309 Allergic rhinitis, unspecified: Secondary | ICD-10-CM

## 2016-02-24 MED ORDER — MONTELUKAST SODIUM 10 MG PO TABS: ORAL_TABLET | ORAL | Status: DC

## 2016-02-24 NOTE — Patient Instructions (Addendum)
   Use Albuterol neb every 4 hours as needed.  Saline nasal wash 2-4 times daily.  Continue Dulera, Zyrtec and  Rhinocort daily.  Restart Singulair 10 mg daily.  Continue Xolair.  Follow-up in 4 months or sooner if needed.

## 2016-02-24 NOTE — Progress Notes (Signed)
FOLLOW UP NOTE  RE: VASSILIKI FRAGER MRN: [id number] DOB: 09-Apr-1951 [facility]. [address] [place], [place] [address] Date of Office Visit: 02/24/2016  Subjective:  BABETTE MCLAMORE is a 65 y.o. female who presents today for Cough; Nasal Congestion; Sinus Problem; and Headache  Assessment:   1. Moderate/severe persistent asthma, improved on Xolair, recent intermittent symptoms.   2. Allergic rhinoconjunctivitis   3.     Recent viral respiratory illness, afebrile in no respiratory distress.   4.     Obesity. Plan:   Meds ordered this encounter  Medications  . montelukast (SINGULAIR) 10 MG tablet    Sig: Take one tablet each evening to prevent cough or wheeze.    Dispense:  30 tablet    Refill:  5   Patient Instructions  1.  Use Albuterol neb every 4 hours as needed--schedule twice daily over the next several days (or albuterol HFA). 2.  Saline nasal wash 2-4 times daily. 3.  Continue Dulera, Zyrtec and  Rhinocort daily. 4.  Restart Singulair 10 mg daily and use Qvar 32mcg 4 puffs midday until sample empty. 5.  Continue Xolair. 6.  Kendrianna has prednisone on hold at home to initiate as discussed and will call with any persisting symptoms.  7.  Follow-up in 4 months or sooner if needed.  HPI: Lilyan, who has a history of persistent asthma on Xolair--had not followed up as discussed, since March 2016 returns to the office with report of intermittent cough and congestion.  She reports "flulike illness" 2 weeks ago, fever, headache, stomach upset and known sick contacts, which has improved significantly.  Describes mild persisting nasal congestion, postnasal drip, intermittent cough without shortness of breath, wheeze, difficulty breathing or chest tightness.  Denies discolored drainage with resolving sinus pressure and intermittent headache.  She has been using Rhinocort recently as insurance is not covering Nasonex and no recent Qvar.  Reports continued  satisfaction with Xolair and no difficulties with receiving.  She is very pleased with her regime and requests refills of medications today. Denies ED or urgent care visits, prednisone or antibiotic courses. Reports sleep and activity are normal.  Zarianna has a current medication list which includes the following prescription(s): albuterol neb, albuterol, beclomethasone, budesonide, cetirizine, esomeprazole, mometasone-formoterol, and multivitamin, and the following Facility-Administered Medications: omalizumab.   Drug Allergies: Allergies  Allergen Reactions  . Aspirin Shortness Of Breath and Rash  . Chloramphenicols Shortness Of Breath    Felt "whoozy"  . Procaine Hcl Other (See Comments)     muscle spasm of neck  . Penicillins Rash   Objective:   Filed Vitals:   02/24/16 1611  BP: 124/90  Pulse: 56  Temp: 98.2 F (36.8 C)  Resp: 16   SpO2 Readings from Last 1 Encounters:  02/24/16 96%   Physical Exam  Constitutional: She is well-developed, well-nourished, and in no distress.  HENT:  Head: Atraumatic.  Right Ear: Tympanic membrane and ear canal normal.  Left Ear: Tympanic membrane and ear canal normal.  Nose: Mucosal edema present. No rhinorrhea. No epistaxis.  Mouth/Throat: Oropharynx is clear and moist and mucous membranes are normal. No oropharyngeal exudate, posterior oropharyngeal edema or posterior oropharyngeal erythema.  Neck: Neck supple.  Cardiovascular: Normal rate, S1 normal and S2 normal.   No murmur heard. Pulmonary/Chest: Effort normal. She has no rhonchi. She has no rales.  Post Xopenex/Atrovent neb: Continues to be clear without adventitious breath sounds, patient reports improved aeration.  Lymphadenopathy:    She has no cervical adenopathy.   Diagnostics: Spirometry:  FVC 1.86--72%, FEV1 1.71--79 %, postbronchodilator improvement,  FVC 2.48---96%, FEV1 1.85--85 %.     Shewanda Sharpe M. Ishmael Holter, MD  cc: [name], MD

## 2016-03-09 ENCOUNTER — Ambulatory Visit (INDEPENDENT_AMBULATORY_CARE_PROVIDER_SITE_OTHER): Payer: BC Managed Care – PPO

## 2016-03-09 DIAGNOSIS — J4540 Moderate persistent asthma, uncomplicated: Secondary | ICD-10-CM

## 2016-03-16 ENCOUNTER — Ambulatory Visit: Payer: BC Managed Care – PPO | Admitting: Gastroenterology

## 2016-03-30 ENCOUNTER — Ambulatory Visit (INDEPENDENT_AMBULATORY_CARE_PROVIDER_SITE_OTHER): Payer: BC Managed Care – PPO

## 2016-03-30 DIAGNOSIS — J4540 Moderate persistent asthma, uncomplicated: Secondary | ICD-10-CM

## 2016-04-13 ENCOUNTER — Ambulatory Visit (INDEPENDENT_AMBULATORY_CARE_PROVIDER_SITE_OTHER): Payer: BC Managed Care – PPO

## 2016-04-13 DIAGNOSIS — J4540 Moderate persistent asthma, uncomplicated: Secondary | ICD-10-CM

## 2016-04-14 ENCOUNTER — Ambulatory Visit (INDEPENDENT_AMBULATORY_CARE_PROVIDER_SITE_OTHER): Payer: BC Managed Care – PPO | Admitting: Gastroenterology

## 2016-04-14 ENCOUNTER — Encounter: Payer: Self-pay | Admitting: Gastroenterology

## 2016-04-14 VITALS — BP 104/80 | HR 74 | Temp 98.0°F | Ht 63.0 in | Wt 243.2 lb

## 2016-04-14 DIAGNOSIS — K219 Gastro-esophageal reflux disease without esophagitis: Secondary | ICD-10-CM

## 2016-04-14 MED ORDER — ESOMEPRAZOLE MAGNESIUM 40 MG PO CPDR: 40.0000 mg | DELAYED_RELEASE_CAPSULE | Freq: Every day | ORAL | Status: DC

## 2016-04-14 NOTE — Progress Notes (Signed)
Referring Provider: [name], MD Primary Care Physician:  [name], MD  Primary GI: Dr. Oneida Alar   Chief Complaint  Patient presents with  . Follow-up    HPI:   Melanie Brown is a 65 y.o. female presenting today with a history of likely IBS-D, recently completing colonoscopy/EGD by Dr. Oneida Alar. Will need surveillance colonoscopy in 2019 with overtube/MAC. No celiac sprue on biopsies.    Not taking Viberzi. Diarrhea is better. Has Bristol scale 4, 5, will sometimes have a pasty loose stool but not always. Eating fruit, veggies, protein. Avoiding nuts. Nexium once daily. Abdominal pain rare. Will have occasional LLQ discomfort but not often. States she would know right where her stool would be. No rectal bleeding. She has intentionally lost weight using weight watchers.   Past Medical History  Diagnosis Date  . Asthma   . Headache(784.0)   . Fibromyalgia   . IBS (irritable bowel syndrome)   . Anxiety     Past Surgical History  Procedure Laterality Date  . Tonsillectomy    . Abdominal hysterectomy    . Cesarean section    . Cholecystectomy    . Sinus endo with fusion  08/09/2012    Procedure: SINUS ENDO WITH FUSION;  Surgeon: Melida Quitter, MD;  Location: [facility];  Service: ENT;  Laterality: Bilateral;  Bilateral Frontal Recess Exploration  . Sphenoidectomy  08/09/2012    Procedure: SPHENOIDECTOMY;  Surgeon: Melida Quitter, MD;  Location: [facility];  Service: ENT;  Laterality: Bilateral;  . Colonoscopy N/A 12/17/2015    Dr. Oneida Alar: multiple colon polyps, tubular adenomas and hyperplastic, sigmoid colon and descending colon diverticulosis, benign colonic mucosa on path, hemorrhoids. Surveillance in 2019 with overtube and MAC   . Esophagogastroduodenoscopy N/A 12/17/2015    Dr. Oneida Alar: multiple small gastric polyps, mild non-erosive gastritis, small hiatal hernia, negative celiac sprue  . Savory dilation N/A 12/17/2015    Procedure: SAVORY DILATION;  Surgeon: Danie Binder, MD;   [facility];  Service: Endoscopy;  Laterality: N/A;    Current Outpatient Prescriptions  Medication Sig Dispense Refill  . albuterol (PROVENTIL HFA;VENTOLIN HFA) 108 (90 Base) MCG/ACT inhaler Inhale 2 puffs into the lungs every 4 (four) hours as needed for wheezing or shortness of breath (cough).    Marland Kitchen albuterol (PROVENTIL) (2.5 MG/3ML) 0.083% nebulizer solution Take 2.5 mg by nebulization every 4 (four) hours as needed for wheezing (cough).     . beclomethasone (QVAR) 80 MCG/ACT inhaler Inhale 3 puffs into the lungs daily as needed. 1 Inhaler 5  . Budesonide (RHINOCORT AQUA NA) Place 2 sprays into the nose daily.    . cetirizine (ZYRTEC) 10 MG tablet Take 10 mg by mouth daily.    Marland Kitchen esomeprazole (NEXIUM) 40 MG capsule Take 40 mg by mouth daily before breakfast.    . Mometasone Furo-Formoterol Fum (DULERA) 200-5 MCG/ACT AERO Inhale 2 puffs into the lungs 2 (two) times daily.    . montelukast (SINGULAIR) 10 MG tablet Take one tablet each evening to prevent cough or wheeze. 30 tablet 5  . Multiple Vitamin (MULTIVITAMIN) capsule Take 1 capsule by mouth daily. Reported on 02/24/2016    . omalizumab (XOLAIR) 150 MG injection Inject into the skin every 14 (fourteen) days.    . hydrocortisone (ANUSOL-HC) 2.5 % rectal cream Place 1 application rectally 2 (two) times daily. USE FOR 10 DAYS (Patient not taking: Reported on 02/24/2016) 30 g 1  . mometasone (NASONEX) 50 MCG/ACT nasal spray Place 1 spray into  the nose 2 (two) times daily. Reported on 04/14/2016     Current Facility-Administered Medications  Medication Dose Route Frequency Provider Last Rate Last Dose  . omalizumab Arvid Right) injection 225 mg  225 mg Subcutaneous Q14 Days Gean Quint, MD   225 mg at 04/13/16 [id number]    Allergies as of 04/14/2016 - Review Complete 04/14/2016  Allergen Reaction Noted  . Aspirin Shortness Of Breath and Rash 05/23/2008  . Chloramphenicols Shortness Of Breath 12/28/2013  . Procaine hcl Other (See  Comments) 05/23/2008  . Penicillins Rash 05/23/2008    Family History  Problem Relation Age of Onset  . Colon cancer Father 75    COLON RESECTED  . Colon polyps Neg Hx     Social History   Social History  . Marital Status: Single    Spouse Name: N/A  . Number of Children: 2  . Years of Education: college   Occupational History  . [employer]    Social History Main Topics  . Smoking status: Never Smoker   . Smokeless tobacco: Never Used  . Alcohol Use: No  . Drug Use: No  . Sexual Activity: Not Asked   Other Topics Concern  . None   Social History Narrative    Review of Systems: As mentioned in HPI.   Physical Exam: BP 104/80 mmHg  Pulse 74  Temp(Src) 98 F (36.7 C)  Ht 5\' 3"  (1.6 m)  Wt 243 lb 3.2 oz (110.315 kg)  BMI 43.09 kg/m2 General:   Alert and oriented. No distress noted. Pleasant and cooperative.  Head:  Normocephalic and atraumatic. Eyes:  Conjuctiva clear without scleral icterus. Abdomen:  +BS, soft, non-tender and non-distended. No rebound or guarding. No HSM or masses noted. Msk:  Symmetrical without gross deformities. Normal posture. Extremities:  Without edema. Neurologic:  Alert and  oriented x4;  grossly normal neurologically. Psych:  Alert and cooperative. Normal mood and affect.

## 2016-04-14 NOTE — Patient Instructions (Signed)
Continue Nexium once a day. I sent this to your pharmacy in a 90 day supply to see if it would be more cost effective than over the counter.  We will see you back in 1 year!  Congratulations on retirement and your healthy lifestyle!

## 2016-04-16 NOTE — Assessment & Plan Note (Addendum)
Doing well with Nexium. Dysphagia resolved. Diarrhea much improved. NOT TAKING VIBERZI. She was instructed to avoid this in the future due to recent FDA contraindication of Viberzi with post-cholecystectomy patients. Next colonoscopy in 2019 with overtube/MAC. Will return in 1 year. Applauded on weight loss efforts.

## 2016-04-19 NOTE — Progress Notes (Signed)
cc'ed to pcp °

## 2016-04-27 ENCOUNTER — Ambulatory Visit (INDEPENDENT_AMBULATORY_CARE_PROVIDER_SITE_OTHER): Payer: BC Managed Care – PPO | Admitting: *Deleted

## 2016-04-27 DIAGNOSIS — J4540 Moderate persistent asthma, uncomplicated: Secondary | ICD-10-CM | POA: Diagnosis not present

## 2016-05-11 ENCOUNTER — Ambulatory Visit (INDEPENDENT_AMBULATORY_CARE_PROVIDER_SITE_OTHER): Payer: BC Managed Care – PPO

## 2016-05-11 DIAGNOSIS — J4540 Moderate persistent asthma, uncomplicated: Secondary | ICD-10-CM | POA: Diagnosis not present

## 2016-05-25 ENCOUNTER — Ambulatory Visit (INDEPENDENT_AMBULATORY_CARE_PROVIDER_SITE_OTHER): Payer: BC Managed Care – PPO

## 2016-05-25 DIAGNOSIS — J4542 Moderate persistent asthma with status asthmaticus: Secondary | ICD-10-CM

## 2016-06-08 ENCOUNTER — Ambulatory Visit (INDEPENDENT_AMBULATORY_CARE_PROVIDER_SITE_OTHER): Payer: BC Managed Care – PPO

## 2016-06-08 DIAGNOSIS — J4540 Moderate persistent asthma, uncomplicated: Secondary | ICD-10-CM | POA: Diagnosis not present

## 2016-06-29 ENCOUNTER — Encounter: Payer: Self-pay | Admitting: Allergy and Immunology

## 2016-06-29 ENCOUNTER — Ambulatory Visit (INDEPENDENT_AMBULATORY_CARE_PROVIDER_SITE_OTHER): Payer: BC Managed Care – PPO | Admitting: Allergy and Immunology

## 2016-06-29 ENCOUNTER — Ambulatory Visit (INDEPENDENT_AMBULATORY_CARE_PROVIDER_SITE_OTHER): Payer: BC Managed Care – PPO

## 2016-06-29 VITALS — BP 128/72 | HR 72 | Temp 98.0°F | Resp 16

## 2016-06-29 DIAGNOSIS — J4540 Moderate persistent asthma, uncomplicated: Secondary | ICD-10-CM

## 2016-06-29 DIAGNOSIS — H1010 Acute atopic conjunctivitis, unspecified eye: Secondary | ICD-10-CM

## 2016-06-29 DIAGNOSIS — J309 Allergic rhinitis, unspecified: Secondary | ICD-10-CM

## 2016-06-29 NOTE — Progress Notes (Signed)
FOLLOW UP NOTE  RE: Melanie Brown MRN: [id number] DOB: [birth year] [facility]. [address]. [place], [place] [address] Date of Office Visit: 06/29/2016  Subjective:  Melanie Brown is a 65 y.o. female who presents today for Asthma  Assessment:   1. Moderate persistent asthma, intermittent symptoms in no respiratory distress---overall improved control on Xolair and Dulera.  2. Allergic rhinoconjunctivitis.   3.      Obesity. 4.      Complex medical history. Plan:   Meds ordered this encounter  Medications  . montelukast (SINGULAIR) 10 MG tablet    Sig: Take one tablet each evening to prevent cough or wheeze.    Dispense:  30 tablet    Refill:  5  1.  Continue current regime--- Melanie Brown, Singulair, Rhinocort and Zyrtec.   2.  Use albuterol neb twice daily for next few days and as needed.  (ProAir HFA as needed every 4 hours)--including when home today 3.  Saline nasal wash each evening at shower time. 4.  Continue QVAR 3 puffs midday until symptom-free for one week--call with any persisting symptoms --questions or concerns. (patient is aware of 24 hour on call physician availability--has Prednisone available at home on hold). 5.  Receive influenza vaccine through primary MD during season. 6.  Follow-up in 4-6 months or sooner if needed.  HPI: Melanie Brown returns to the office in follow-up of asthma and allergic rhinoconjunctivitis, no retired from full time position.  Since her last visit in March, she reports feeling very good---her regime is very beneficial and feels the best she has used ( prefers to remain on [place] given significant benefit as long as able).  She denies recurring nasal congestion, sneezing, post nasal drip or "sinus concerns".  She was exposed to paint at her part time job 2 days ago and has noted throat clearing/post nasal drip with slight cough/wheeze without difficulty in breathing, chest congestion or shortness of breath.   She started using QVAR in the last few days but no albuterol.  She reports her sleep and activity are without disruptions.  Denies other recurring chest symptoms, albuterol use typically is less than once a month. She is very pleased with how well she has done, in particular with Xolair. She did have diverticulitis diagnosis and has now lost 52 pounds with dietary changes.  Denies ED or urgent care visits, prednisone or antibiotic courses. Reports sleep and activity are normal.  Melanie Brown has a current medication list which includes the following prescription(s): albuterol, albuterol neb, beclomethasone, budesonide, cetirizine, esomeprazole, mometasone-formoterol, montelukast, multivitamin and the following Facility-Administered Medications: omalizumab.   Drug Allergies: Allergies  Allergen Reactions  . Aspirin Shortness Of Breath and Rash  . Chloramphenicols Shortness Of Breath    Felt "whoozy"  . Procaine Hcl Other (See Comments)     muscle spasm of neck  . Penicillins Rash   Objective:   Filed Vitals:   06/29/16 1616  BP: 128/72  Pulse: 72  Temp: 98 F (36.7 C)  Resp: 16   SpO2 Readings from Last 1 Encounters:  06/29/16 97%   Physical Exam  Constitutional: She is well-developed, well-nourished, and in no distress.  Alert interactive communicating easily in full sentences.  HENT:  Head: Atraumatic.  Right Ear: Tympanic membrane and ear canal normal.  Left Ear: Tympanic membrane and ear canal normal.  Nose: Mucosal edema present. No rhinorrhea. No epistaxis.  Mouth/Throat: Oropharynx is clear and moist and mucous membranes  are normal. No oropharyngeal exudate, posterior oropharyngeal edema or posterior oropharyngeal erythema.  Neck: Neck supple.  Cardiovascular: Normal rate, S1 normal and S2 normal.   No murmur heard. Pulmonary/Chest: Effort normal. No accessory muscle usage. No respiratory distress. She has no wheezes. She has no rhonchi. She has no rales.  Rare end expiratory  wheeze with good aeration, without rhonchi or crackles.  Lymphadenopathy:    She has no cervical adenopathy.   Diagnostics: Spirometry:  FVC  1.75---68%, FEV1 1.61---74%.    Melanie M. Ishmael Holter, MD  cc: [name], MD

## 2016-06-29 NOTE — Patient Instructions (Addendum)
    Continue current regime--- Aggie Cosier, Singulair, Rhinocort and Zyrtec.    Use albuterol neb twice daily for next few days and as needed.  (Pro air HFA as needed every 4 hours).  Saline nasal wash each evening at shower time.  Continue QVAR 3 puffs midday until symptom-free for one week.  Receive influenza vaccine through primary M.D. during season.  Follow-up in 4-6 months or sooner if needed.

## 2016-06-30 DIAGNOSIS — J309 Allergic rhinitis, unspecified: Secondary | ICD-10-CM | POA: Diagnosis not present

## 2016-07-05 MED ORDER — MONTELUKAST SODIUM 10 MG PO TABS: ORAL_TABLET | ORAL | Status: DC

## 2016-07-20 ENCOUNTER — Ambulatory Visit (INDEPENDENT_AMBULATORY_CARE_PROVIDER_SITE_OTHER): Payer: BC Managed Care – PPO

## 2016-07-20 DIAGNOSIS — J4540 Moderate persistent asthma, uncomplicated: Secondary | ICD-10-CM

## 2016-08-03 ENCOUNTER — Ambulatory Visit (INDEPENDENT_AMBULATORY_CARE_PROVIDER_SITE_OTHER): Payer: BC Managed Care – PPO

## 2016-08-03 DIAGNOSIS — J4540 Moderate persistent asthma, uncomplicated: Secondary | ICD-10-CM | POA: Diagnosis not present

## 2016-08-17 ENCOUNTER — Ambulatory Visit (INDEPENDENT_AMBULATORY_CARE_PROVIDER_SITE_OTHER): Payer: BC Managed Care – PPO

## 2016-08-17 DIAGNOSIS — J4540 Moderate persistent asthma, uncomplicated: Secondary | ICD-10-CM

## 2016-08-31 ENCOUNTER — Ambulatory Visit (INDEPENDENT_AMBULATORY_CARE_PROVIDER_SITE_OTHER): Payer: BC Managed Care – PPO | Admitting: *Deleted

## 2016-08-31 DIAGNOSIS — J4540 Moderate persistent asthma, uncomplicated: Secondary | ICD-10-CM | POA: Diagnosis not present

## 2016-09-14 ENCOUNTER — Ambulatory Visit (INDEPENDENT_AMBULATORY_CARE_PROVIDER_SITE_OTHER): Payer: BC Managed Care – PPO | Admitting: *Deleted

## 2016-09-14 ENCOUNTER — Ambulatory Visit: Payer: BC Managed Care – PPO

## 2016-09-14 DIAGNOSIS — J4540 Moderate persistent asthma, uncomplicated: Secondary | ICD-10-CM | POA: Diagnosis not present

## 2016-09-28 ENCOUNTER — Ambulatory Visit: Payer: BC Managed Care – PPO | Admitting: *Deleted

## 2016-09-28 ENCOUNTER — Ambulatory Visit (INDEPENDENT_AMBULATORY_CARE_PROVIDER_SITE_OTHER): Payer: BC Managed Care – PPO | Admitting: *Deleted

## 2016-09-28 DIAGNOSIS — J4540 Moderate persistent asthma, uncomplicated: Secondary | ICD-10-CM

## 2016-10-12 ENCOUNTER — Ambulatory Visit: Payer: BC Managed Care – PPO

## 2016-10-12 ENCOUNTER — Ambulatory Visit (INDEPENDENT_AMBULATORY_CARE_PROVIDER_SITE_OTHER): Payer: BC Managed Care – PPO

## 2016-10-12 DIAGNOSIS — J4540 Moderate persistent asthma, uncomplicated: Secondary | ICD-10-CM | POA: Diagnosis not present

## 2016-10-20 ENCOUNTER — Encounter: Payer: Self-pay | Admitting: Allergy & Immunology

## 2016-10-20 ENCOUNTER — Ambulatory Visit (INDEPENDENT_AMBULATORY_CARE_PROVIDER_SITE_OTHER): Payer: BC Managed Care – PPO | Admitting: Allergy & Immunology

## 2016-10-20 VITALS — BP 132/90 | HR 68 | Temp 97.5°F | Wt 255.6 lb

## 2016-10-20 DIAGNOSIS — J4551 Severe persistent asthma with (acute) exacerbation: Secondary | ICD-10-CM

## 2016-10-20 DIAGNOSIS — J3089 Other allergic rhinitis: Secondary | ICD-10-CM

## 2016-10-20 MED ORDER — BECLOMETHASONE DIPROPIONATE 80 MCG/ACT IN AERS
4.0000 | INHALATION_SPRAY | Freq: Every day | RESPIRATORY_TRACT | 5 refills | Status: DC | PRN
Start: 2016-10-20 — End: 2017-11-15

## 2016-10-20 MED ORDER — IPRATROPIUM-ALBUTEROL 0.5-2.5 (3) MG/3ML IN SOLN: 3.0000 mL | RESPIRATORY_TRACT | 3 refills | Status: DC | PRN

## 2016-10-20 MED ORDER — METHYLPREDNISOLONE ACETATE 80 MG/ML IJ SUSP
80.0000 mg | Freq: Once | INTRAMUSCULAR | Status: AC
  Administered 2016-10-20: 80 mg via INTRAMUSCULAR

## 2016-10-20 MED ORDER — MOMETASONE FURO-FORMOTEROL FUM 200-5 MCG/ACT IN AERO: 2.0000 | INHALATION_SPRAY | Freq: Two times a day (BID) | RESPIRATORY_TRACT | 5 refills | Status: DC

## 2016-10-20 NOTE — Progress Notes (Signed)
FOLLOW UP  Date of Service/Encounter:  10/20/16   Assessment:   Severe persistent asthma with acute exacerbation - Plan: Spirometry with Graph  Chronic nonseasonal allergic rhinitis due to pollen    Asthma Reportables:  Severity: severe persistent  Risk: high due to recurrent oral steroid courses Control: not well controlled  Seasonal Influenza Vaccine: yes    Plan/Recommendations:    1. Chronic allergic rhinitis - Continue with Singulair 10mg  daily, Rhinocort 1-2 sprays per nostril daily, and cetirizine 10mg  daily. - Could consider adding on immunotherapy as a means of controlling her allergies as well as her asthma, however she only had a couple of molds that were positive on previous testing. - Therefore we would need to consider doing allergy testing again.   2. Severe persistent asthma with acute exacerbation - with reversibility noted today - DuoNeb nebulizer treatment given in clinic with improvement in breathing. - Steroid injection provided in clinic as well. - Start the following prednisone taper: 60mg  (6 tablets) daily for four days, 40mg  (four tablets) daily for four days, 20mg  (two tablets) daily for four days, 10mg  (one tablet) daily for four days, 10mg  (one tablet) every other day for four doses, then STOP - Daily controller medication(s): Dulera 200/5 two puffs twice daily WITH SPACER - Rescue medications: ProAir 4 puffs every 4-6 hours as needed, albuterol nebulizer one vial puffs every 4-6 hours as needed or DuoNeb nebulizer one vial every 4-6 hours as needed - Changes during respiratory infections or worsening symptoms: add Qvar 4 puffs midday for TWO WEEKS WITH SPACER - We could consider the addition of Nucala in the future, although I am unsure whether Xolair and Nucala can be given together.  - I am optimistic that administering the [facility] with a spacer will help with medication delivery and subsequent improvement in symptoms. - Asthma control goals:  *  Full participation in all desired activities (may need albuterol before activity) * Albuterol use two time or less a week on average (not counting use with activity) * Cough interfering with sleep two time or less a month * Oral steroids no more than once a year * No hospitalizations  3. Return in about 3 weeks (around 11/09/2016) at 10:00am   Subjective:   Melanie Brown is a 65 y.o. female presenting today for follow up of  Chief Complaint  Patient presents with  . Follow-up    Asthma flare up since July. Getting worse.   Melanie Brown has a history of the following: Patient Active Problem List   Diagnosis Date Noted  . Dysphagia   . Change in bowel habits 11/20/2015  . Rectal bleeding 11/20/2015  . GERD (gastroesophageal reflux disease) 11/20/2015  . Loose stools 11/20/2015  . Cervical radiculopathy 12/28/2013  . Headache, migraine 12/03/2013    History obtained from: chart review and patient.  Melanie Brown was referred by [name], MD.     Melanie Brown is a 65 y.o. female presenting for a follow up visit. Ms. Wannamaker was last seen in July 2017 by Dr. Ishmael Holter, who has since left the practice. At that time, she was stable on Xolair, Dulera, Singulair, Rhinocort, and Zyrtec. Qvar 3 puffs in the middle of the day was added during respiratory symptoms. Her last skin testing was performed in May 2013 and was positive only to mold mix #2 and equivocal to horse.  Since the last visit, she has not done well. She reports that she has had wheezing, coughing, and  shortness of breath for approximately 6-8 weeks. Symptoms started in July and were intermittent during that month. In August, she decided to take some leftover prednisone she had at home. She does not remember the dosing, but only took it for 3 days. This did get her through August, but never alleviated her symptoms entirely. In September, her symptoms returned and have persisted since then. She is using her rescue inhaler  at least 2 times per day, although usually more. She remains on her Dulera 200/5 twice daily. She does not use spacer. She also remains on Xolair, which she gets twice monthly.  Ariadna has been on [facility] for approximately 3-1/2 years with good success. She has never used a spacer. She was started on Xolair approximately 3 years ago and has felt markedly improved with this. She estimates that she requires prednisone approximately 2 times per year, typically in the spring and the fall which are her worst seasons. Triggers include cold weather, heat, and seasonal allergies. Even during her better seasons, she does use albuterol 1-2 times per week. She has had to change her activity because of her symptoms.  Nives uses cetirizine 10 mg daily, Singulair 10 mg daily, and Rhinocort 2 sprays per nostril daily for her allergies. This combination seems to control her symptoms well. She never started allergy shots for allergy testing was essentially negative aside from one mold mix. Her worst times of the year again on the spring and fall.  Otherwise, there have been no changes to her past medical history, surgical history, family history, or social history. She is a retired [employer]. She just retired in July. She continues to stay busy with volunteering.    Review of Systems: a 14-point review of systems is pertinent for what is mentioned in HPI.  Otherwise, all other systems were negative. Constitutional: negative other than that listed in the HPI Eyes: negative other than that listed in the HPI Ears, nose, mouth, throat, and face: negative other than that listed in the HPI Respiratory: negative other than that listed in the HPI Cardiovascular: negative other than that listed in the HPI Gastrointestinal: negative other than that listed in the HPI Genitourinary: negative other than that listed in the HPI Integument: negative other than that listed in the HPI Hematologic: negative other  than that listed in the HPI Musculoskeletal: negative other than that listed in the HPI Neurological: negative other than that listed in the HPI Allergy/Immunologic: negative other than that listed in the HPI    Objective:   Blood pressure 132/90, pulse 68, temperature 97.5 F (36.4 C), temperature source Oral, weight 255 lb 9.6 oz (115.9 kg), SpO2 94 %. Body mass index is 45.28 kg/m.   Physical Exam:  General: Alert, interactive, in no acute distress.Cooperative with the exam, speaking in full sentences, but taking multiple breaths. HEENT: TMs pearly gray, turbinates edematous with clear discharge, post-pharynx mildly erythematous. Neck: Supple without thyromegaly. Lungs: Decreased breath sounds with expiratory wheezing bilaterally. Increased work of breathing. No crackles noted. CV: Physiologic splitting of S1/S2, no murmurs. Capillary refill <2 seconds.  Abdomen: Nondistended, nontender. No guarding or rebound tenderness. Bowel sounds present in all fields and hyperactive  Skin: Warm and dry, without lesions or rashes. Extremities:  No clubbing, cyanosis or edema. Neuro:   Grossly intact. No focal deficits noted.   Diagnostic studies:  Spirometry: results abnormal (FEV1: 1.18/52%, FVC: 1.30/46%, FEV1/FVC: 90%).    Spirometry consistent with mixed obstructive and restrictive disease. DuoNeb nebulizer treatment given  in clinic with significant improvement. The FVC increased 21%, the FEV1 increases 22%, and the FEF 25-75 percent increased 60%.  Allergy Studies: None   Salvatore Marvel, MD [facility]

## 2016-10-20 NOTE — Patient Instructions (Addendum)
1. Chronic allergic rhinitis - Continue with Singulair 10mg  daily, Rhinocort 1-2 sprays per nostril daily, and cetirizine 10mg  daily  2. Severe persistent asthma with acute exacerbation - DuoNeb nebulizer treatment given in clinic with improvement in breathing. - Steroid injection provided in clinic as well. - Start the following prednisone taper: 60mg  (6 tablets) daily for four days, 40mg  (four tablets) daily for four days, 20mg  (two tablets) daily for four days, 10mg  (one tablet) daily for four days, 10mg  (one tablet) every other day for four doses, then STOP - Daily controller medication(s): Dulera 200/5 two puffs twice daily WITH SPACER - Rescue medications: ProAir 4 puffs every 4-6 hours as needed, albuterol nebulizer one vial puffs every 4-6 hours as needed or DuoNeb nebulizer one vial every 4-6 hours as needed - Changes during respiratory infections or worsening symptoms: add Qvar 4 puffs midday for TWO WEEKS WITH SPACER - Asthma control goals:  * Full participation in all desired activities (may need albuterol before activity) * Albuterol use two time or less a week on average (not counting use with activity) * Cough interfering with sleep two time or less a month * Oral steroids no more than once a year * No hospitalizations  3. Return in about 3 weeks (around 11/09/2016) at 10:00am  Please inform us of any Emergency Department visits, hospitalizations, or changes in symptoms. Call us before going to the ED for breathing or allergy symptoms since we might be able to fit you in for a sick visit. Feel free to contact us anytime with any questions, problems, or concerns.  It was a pleasure to meet you today!   Websites that have reliable patient information: 1. American Academy of Asthma, Allergy, and Immunology: www.aaaai.org 2. Food Allergy Research and Education (FARE): foodallergy.org 3. Mothers of Asthmatics: http://www.asthmacommunitynetwork.org 4. American College of Allergy,  Asthma, and Immunology: www.acaai.org

## 2016-10-26 ENCOUNTER — Ambulatory Visit (INDEPENDENT_AMBULATORY_CARE_PROVIDER_SITE_OTHER): Payer: BC Managed Care – PPO | Admitting: *Deleted

## 2016-10-26 ENCOUNTER — Ambulatory Visit: Payer: BC Managed Care – PPO

## 2016-10-26 DIAGNOSIS — J4551 Severe persistent asthma with (acute) exacerbation: Secondary | ICD-10-CM | POA: Diagnosis not present

## 2016-11-09 ENCOUNTER — Ambulatory Visit: Payer: BC Managed Care – PPO

## 2016-11-09 ENCOUNTER — Ambulatory Visit (INDEPENDENT_AMBULATORY_CARE_PROVIDER_SITE_OTHER): Payer: BC Managed Care – PPO | Admitting: Allergy & Immunology

## 2016-11-09 ENCOUNTER — Ambulatory Visit (INDEPENDENT_AMBULATORY_CARE_PROVIDER_SITE_OTHER): Payer: BC Managed Care – PPO | Admitting: *Deleted

## 2016-11-09 VITALS — BP 126/72 | HR 65 | Temp 98.0°F | Resp 13

## 2016-11-09 DIAGNOSIS — J4550 Severe persistent asthma, uncomplicated: Secondary | ICD-10-CM

## 2016-11-09 DIAGNOSIS — J984 Other disorders of lung: Secondary | ICD-10-CM | POA: Diagnosis not present

## 2016-11-09 DIAGNOSIS — J4551 Severe persistent asthma with (acute) exacerbation: Secondary | ICD-10-CM

## 2016-11-09 DIAGNOSIS — J3089 Other allergic rhinitis: Secondary | ICD-10-CM | POA: Diagnosis not present

## 2016-11-09 DIAGNOSIS — Z8709 Personal history of other diseases of the respiratory system: Secondary | ICD-10-CM | POA: Diagnosis not present

## 2016-11-09 DIAGNOSIS — Z789 Other specified health status: Secondary | ICD-10-CM | POA: Diagnosis not present

## 2016-11-09 DIAGNOSIS — J339 Nasal polyp, unspecified: Secondary | ICD-10-CM | POA: Insufficient documentation

## 2016-11-09 DIAGNOSIS — T39015A Adverse effect of aspirin, initial encounter: Secondary | ICD-10-CM

## 2016-11-09 DIAGNOSIS — J45909 Unspecified asthma, uncomplicated: Secondary | ICD-10-CM

## 2016-11-09 DIAGNOSIS — Z886 Allergy status to analgesic agent status: Secondary | ICD-10-CM

## 2016-11-09 MED ORDER — AZELASTINE-FLUTICASONE 137-50 MCG/ACT NA SUSP: 2.0000 | Freq: Every day | NASAL | 5 refills | Status: DC

## 2016-11-09 NOTE — Patient Instructions (Signed)
1. Chronic allergic rhinitis - Continue with Singulair 10mg  daily and cetirizine 10mg  daily. - Stop the Rhinocort for now and start Dymista 2 sprays 1-2 times daily. - Let us know if you like the Dymista and we can send in a nasal antihistamine to go with your nasal steroid.   2. Severe persistent asthma, uncomplicated with coexisting aspirin exacerbated respiratory disease (Samter's Triad) - Continue with current regimen since that seems to be doing well. - Lung function looked good today.  - We can consider aspirin desensitization in the future if your asthma becomes uncontrollable.  - Daily controller medication(s): Dulera 200/5 two puffs twice daily WITH SPACER - Rescue medications: ProAir 4 puffs every 4-6 hours as needed or albuterol nebulizer one vial puffs every 4-6 hours as needed - Changes during respiratory infections or worsening symptoms: add Qvar 4 puffs midday for TWO WEEKS WITH SPACER - Asthma control goals:  * Full participation in all desired activities (may need albuterol before activity) * Albuterol use two time or less a week on average (not counting use with activity) * Cough interfering with sleep two time or less a month * Oral steroids no more than once a year * No hospitalizations  3. Return in about 3 months.  Please inform us of any Emergency Department visits, hospitalizations, or changes in symptoms. Call us before going to the ED for breathing or allergy symptoms since we might be able to fit you in for a sick visit. Feel free to contact us anytime with any questions, problems, or concerns.  It was a pleasure to see you again today! Have a wonderful Thanksgiving with your daughter and her family!   Websites that have reliable patient information: 1. American Academy of Asthma, Allergy, and Immunology: www.aaaai.org 2. Food Allergy Research and Education (FARE): foodallergy.org 3. Mothers of Asthmatics: http://www.asthmacommunitynetwork.org 4. American  College of Allergy, Asthma, and Immunology: www.acaai.org

## 2016-11-09 NOTE — Progress Notes (Signed)
FOLLOW UP  Date of Service/Encounter:  11/09/16   Assessment:   Severe persistent asthma, uncomplicated  Chronic nonseasonal allergic rhinitis due to fungal spores  Disorder of respiratory system exacerbated by aspirin  History of nasal polyposis  Aspirin intolerance   Asthma Reportables:  Severity: severe persistent  Risk: low Control: well controlled  Seasonal Influenza Vaccine: no but encouraged    Plan/Recommendations:   1. Chronic allergic rhinitis - Continue with Singulair 10mg  daily and cetirizine 10mg  daily. - Ms. Vozar has never been on a nasal antihistamine, therefore we will give this a try. - We do not have samples of a nasal antihistamine alone, therefore we will give a sample of Dymista instead.  - Stop the Rhinocort for now and start Dymista 2 sprays 1-2 times daily. - Patient will let us know how she likes this regimen.    2. Severe persistent asthma, uncomplicated with coexisting AERD - Continue with current regimen since that seems to be doing well. - Ms. Shafran is markedly improved with the use of the spacer.  - Lung function looked good today.  - We can consider aspirin desensitization in the future if Ms. Boswel's asthma becomes uncontrollable.  - This is an outpatient procedure that would likely take all day. - This would only be performed in the [place] office.  - Daily controller medication(s): Dulera 200/5 two puffs twice daily WITH SPACER - Rescue medications: ProAir 4 puffs every 4-6 hours as needed or albuterol nebulizer one vial puffs every 4-6 hours as needed - Changes during respiratory infections or worsening symptoms: add Qvar 4 puffs midday for TWO WEEKS WITH SPACER - Asthma control goals:  * Full participation in all desired activities (may need albuterol before activity) * Albuterol use two time or less a week on average (not counting use with activity) * Cough interfering with sleep two time or less a month * Oral  steroids no more than once a year * No hospitalizations  3. Return in about 3 months.  Subjective:   NUR GOSSMAN is a 65 y.o. female presenting today for follow up of  Chief Complaint  Patient presents with  . Follow-up    Asthma, Patient states she is doing great  .  CHAUNTELLE WOOLSTENHULME has a history of the following: Patient Active Problem List   Diagnosis Date Noted  . Disorder of respiratory system exacerbated by aspirin 11/09/2016  . Chronic nonseasonal allergic rhinitis due to fungal spores 11/09/2016  . Severe persistent asthma, uncomplicated 99991111  . Aspirin intolerance 11/09/2016  . History of nasal polyposis 11/09/2016  . Dysphagia   . Change in bowel habits 11/20/2015  . Rectal bleeding 11/20/2015  . GERD (gastroesophageal reflux disease) 11/20/2015  . Loose stools 11/20/2015  . Cervical radiculopathy 12/28/2013  . Headache, migraine 12/03/2013    History obtained from: chart review and patient.  [name] was referred by [name], MD.     Maurica is a 65 y.o. female presenting for a follow up visit. We last saw the patient on November 1st, 2017. At that time, she was diagnosed with an asthma exacerbation. She did respond well to nebulizer treatment in clinic. We gave her a steroid injection and started her on a prednisone taper. We continued her on Dulera 200 two puffs in the morning and two puffs at night, but emphasized the use of the spacer. We added Qvar 4 puffs in the middle of the day for 2 weeks during increased respiratory symptoms. We  discussed adding Nucala in the future. Her chronic allergic rhinitis was controlled with Singulair 10 mg daily, Rhinocort 1-2 sprays per nostril daily, and cetirizine 10 mg daily. She has only had testing that was positive to molds in the past, but we did discuss starting immunotherapy if needed as another method of controlling her asthma.  Since last visit, she has done well. She reports that her breathing is much  better. She is using the spacer and doing well with it. She feels that she gets more of her medication. Her nose is under fairly good control with the current regimen. She does continue to have postnasal drip that is worse in the morning. She has a history of gastroesophageal reflux disease and takes Nexium every morning. She has never been on a twice a day dose of proton pump inhibitor. She is not on ranitidine currently. For the postnasal drip, she is currently on Rhinocort 1-2 sprays per nostril once daily. She has been on Nasonex in the past but has never been on a nasal antihistamine. She does endorse some nasal dryness at this time. She does use an nasal saline rinse every 1-2 days.  Of note, she does have a history of nasal polyposis. She is status post polypectomy performed in 2013 by Dr. Redmond Baseman. She has not seen Dr. Redmond Baseman in a couple of years. She also has a history of aspirin sensitivity, which resulted in worsening of her breathing status as well as a mild rash on her hands. She has never had a discussion about an aspirin to sensitization for aspirin exacerbated respiratory disease. She avoids all NSAIDs at this time.  Otherwise, there have been no changes to her past medical history, surgical history, family history, or social history. She is in the process of packing her home and downsizing. She is going to be living with her daughter for a period of time until she decides whether to purchase a condo or house and where to purchase a home. She will be selling her current home in the next few months.     Review of Systems: a 14-point review of systems is pertinent for what is mentioned in HPI.  Otherwise, all other systems were negative. Constitutional: negative other than that listed in the HPI Eyes: negative other than that listed in the HPI Ears, nose, mouth, throat, and face: negative other than that listed in the HPI Respiratory: negative other than that listed in the HPI Cardiovascular:  negative other than that listed in the HPI Gastrointestinal: negative other than that listed in the HPI Genitourinary: negative other than that listed in the HPI Integument: negative other than that listed in the HPI Hematologic: negative other than that listed in the HPI Musculoskeletal: negative other than that listed in the HPI Neurological: negative other than that listed in the HPI Allergy/Immunologic: negative other than that listed in the HPI    Objective:   Blood pressure 126/72, pulse 65, temperature 98 F (36.7 C), temperature source Oral, resp. rate 13, SpO2 98 %. There is no height or weight on file to calculate BMI.   Physical Exam:  General: Alert, interactive, in no acute distress.Cooperative with exam. Smiling. HEENT: TMs pearly gray, turbinates edematous with clear discharge, post-pharynx erythematous. Neck: Supple without thyromegaly. Lungs: Clear to auscultation without wheezing, rhonchi or rales. No increased work of breathing. CV: Normal S1/S2, no murmurs. Capillary refill <2 seconds.  Abdomen: Nondistended, nontender. No guarding or rebound tenderness. Bowel sounds absent  Skin: Warm and dry, without  lesions or rashes. Extremities:  No clubbing, cyanosis or edema. Neuro:   Grossly intact.  Diagnostic studies:  Spirometry: results abnormal (FEV1: 1.72/79%, FVC: 1.93/75%, FEV1/FVC: 89%).    Spirometry consistent with possible restrictive disease. Compared to her previous values, both the FVC and FEV1 have increased markedly in the ensuing 3 weeks.  Allergy Studies: None    Salvatore Marvel, MD [facility]

## 2016-11-23 ENCOUNTER — Ambulatory Visit (INDEPENDENT_AMBULATORY_CARE_PROVIDER_SITE_OTHER): Payer: BC Managed Care – PPO | Admitting: *Deleted

## 2016-11-23 ENCOUNTER — Ambulatory Visit: Payer: BC Managed Care – PPO

## 2016-11-23 DIAGNOSIS — J4551 Severe persistent asthma with (acute) exacerbation: Secondary | ICD-10-CM | POA: Diagnosis not present

## 2016-12-07 ENCOUNTER — Ambulatory Visit (INDEPENDENT_AMBULATORY_CARE_PROVIDER_SITE_OTHER): Payer: BC Managed Care – PPO | Admitting: *Deleted

## 2016-12-07 ENCOUNTER — Ambulatory Visit: Payer: BC Managed Care – PPO | Admitting: *Deleted

## 2016-12-07 DIAGNOSIS — J4550 Severe persistent asthma, uncomplicated: Secondary | ICD-10-CM | POA: Diagnosis not present

## 2016-12-07 DIAGNOSIS — J4551 Severe persistent asthma with (acute) exacerbation: Secondary | ICD-10-CM

## 2016-12-21 ENCOUNTER — Ambulatory Visit (INDEPENDENT_AMBULATORY_CARE_PROVIDER_SITE_OTHER): Payer: BC Managed Care – PPO | Admitting: *Deleted

## 2016-12-21 ENCOUNTER — Ambulatory Visit: Payer: BC Managed Care – PPO

## 2016-12-21 DIAGNOSIS — J4540 Moderate persistent asthma, uncomplicated: Secondary | ICD-10-CM

## 2016-12-21 DIAGNOSIS — J4551 Severe persistent asthma with (acute) exacerbation: Secondary | ICD-10-CM

## 2016-12-29 MED ORDER — OMALIZUMAB 150 MG ~~LOC~~ SOLR
225.0000 mg | SUBCUTANEOUS | Status: DC
  Administered 2017-01-11 – 2017-03-08 (×4): 225 mg via SUBCUTANEOUS

## 2016-12-29 NOTE — Addendum Note (Signed)
Addended by: Hartley Barefoot on: 12/29/2016 02:59 PM   Modules accepted: Orders

## 2017-01-04 ENCOUNTER — Ambulatory Visit: Payer: BC Managed Care – PPO

## 2017-01-11 ENCOUNTER — Ambulatory Visit: Payer: BC Managed Care – PPO | Admitting: *Deleted

## 2017-01-11 ENCOUNTER — Ambulatory Visit (INDEPENDENT_AMBULATORY_CARE_PROVIDER_SITE_OTHER): Payer: BC Managed Care – PPO | Admitting: *Deleted

## 2017-01-11 DIAGNOSIS — J4540 Moderate persistent asthma, uncomplicated: Secondary | ICD-10-CM

## 2017-01-11 DIAGNOSIS — J4550 Severe persistent asthma, uncomplicated: Secondary | ICD-10-CM

## 2017-01-14 ENCOUNTER — Other Ambulatory Visit: Payer: Self-pay

## 2017-01-14 MED ORDER — MONTELUKAST SODIUM 10 MG PO TABS: ORAL_TABLET | ORAL | 0 refills | Status: DC

## 2017-01-17 ENCOUNTER — Other Ambulatory Visit: Payer: Self-pay

## 2017-01-18 ENCOUNTER — Telehealth: Payer: Self-pay

## 2017-01-18 MED ORDER — BUDESONIDE-FORMOTEROL FUMARATE 160-4.5 MCG/ACT IN AERO: 2.0000 | INHALATION_SPRAY | Freq: Two times a day (BID) | RESPIRATORY_TRACT | 6 refills | Status: DC

## 2017-01-18 NOTE — Telephone Encounter (Signed)
pts insurance will not cover dulera it will cover advair, breo or symbicort.  Please advise

## 2017-01-18 NOTE — Telephone Encounter (Signed)
I sent in a prescription for Symbicort 160/4.5 two puffs twice daily. There is a copay card that can be used for $0 copay. Please call patient to let her know.  Salvatore Marvel, MD [facility]

## 2017-01-18 NOTE — Addendum Note (Signed)
Addended by: Valentina Shaggy on: 01/18/2017 05:02 PM   Modules accepted: Orders

## 2017-01-19 NOTE — Telephone Encounter (Signed)
Left message for patient with this information. Will put co pay card up front.

## 2017-01-25 ENCOUNTER — Ambulatory Visit: Payer: BC Managed Care – PPO

## 2017-02-01 ENCOUNTER — Encounter: Payer: Self-pay | Admitting: Allergy & Immunology

## 2017-02-01 ENCOUNTER — Ambulatory Visit (INDEPENDENT_AMBULATORY_CARE_PROVIDER_SITE_OTHER): Payer: BC Managed Care – PPO | Admitting: Allergy & Immunology

## 2017-02-01 ENCOUNTER — Telehealth: Payer: Self-pay | Admitting: Allergy & Immunology

## 2017-02-01 ENCOUNTER — Ambulatory Visit: Payer: BC Managed Care – PPO

## 2017-02-01 VITALS — BP 130/90 | HR 85 | Temp 97.7°F | Resp 18

## 2017-02-01 DIAGNOSIS — Z886 Allergy status to analgesic agent status: Secondary | ICD-10-CM | POA: Diagnosis not present

## 2017-02-01 DIAGNOSIS — Z789 Other specified health status: Secondary | ICD-10-CM | POA: Diagnosis not present

## 2017-02-01 DIAGNOSIS — J3089 Other allergic rhinitis: Secondary | ICD-10-CM

## 2017-02-01 DIAGNOSIS — J4550 Severe persistent asthma, uncomplicated: Secondary | ICD-10-CM | POA: Diagnosis not present

## 2017-02-01 DIAGNOSIS — J339 Nasal polyp, unspecified: Secondary | ICD-10-CM | POA: Diagnosis not present

## 2017-02-01 DIAGNOSIS — J45909 Unspecified asthma, uncomplicated: Secondary | ICD-10-CM | POA: Diagnosis not present

## 2017-02-01 MED ORDER — PNEUMOCOCCAL VAC POLYVALENT 25 MCG/0.5ML IJ INJ: 0.5000 mL | INJECTION | INTRAMUSCULAR | 0 refills | Status: AC

## 2017-02-01 MED ORDER — FLUTICASONE-SALMETEROL 115-21 MCG/ACT IN AERO: 2.0000 | INHALATION_SPRAY | Freq: Two times a day (BID) | RESPIRATORY_TRACT | 5 refills | Status: DC

## 2017-02-01 NOTE — Progress Notes (Signed)
FOLLOW UP  Date of Service/Encounter:  02/01/17   Assessment:   Severe persistent asthma - complicated by AERD  Chronic nonseasonal allergic rhinitis   Aspirin intolerance  Samter's triad - interested in ASA desensitization   Asthma Reportables:  Severity: severe persistent  Risk: high Control: not well controlled  Seasonal Influenza Vaccine: yes   Plan/Recommendations:   1. Chronic allergic rhinitis - Continue with Singulair 10mg  daily and cetirizine 10mg  daily. - Continue with Dymista 2 sprays 1-2 times daily.  2. Severe persistent asthma, uncomplicated with coexisting aspirin exacerbated respiratory disease (Samter's Triad) - Continue with current regimen since that seems to be doing well. - Lung function looked good today.  - We did spend some time discussing ASA desensitization, including the possibility that this could improve by the nasal polyps as well as the asthma.  - I will provide additional information about the process to [name] and she will make a decision about pursuing this.  - We will change to Advair 115/21 two puffs twice daily instead of Symbicort.  - Prescription for Pneumovax (pneumonia vaccination) provided.  - Daily controller medication(s): Advair 115/21 two puffs twice daily WITH SPACER + Xolair every two weeks - Rescue medications: ProAir 4 puffs every 4-6 hours as needed or albuterol nebulizer one vial puffs every 4-6 hours as needed - Changes during respiratory infections or worsening symptoms: add Qvar 4 puffs midday for TWO WEEKS WITH SPACER - Asthma control goals:  * Full participation in all desired activities (may need albuterol before activity) * Albuterol use two time or less a week on average (not counting use with activity) * Cough interfering with sleep two time or less a month * Oral steroids no more than once a year * No hospitalizations  3. Return in about 3 months.    Subjective:   Melanie Brown is a 66 y.o.  female presenting today for follow up of  Chief Complaint  Patient presents with  . Cough  . Wheezing  . Nasal Congestion    Melanie Brown has a history of the following: Patient Active Problem List   Diagnosis Date Noted  . Samter's triad 11/09/2016  . Chronic nonseasonal allergic rhinitis due to fungal spores 11/09/2016  . Severe persistent asthma, uncomplicated 99991111  . Aspirin intolerance 11/09/2016  . History of nasal polyposis 11/09/2016  . Dysphagia   . Change in bowel habits 11/20/2015  . Rectal bleeding 11/20/2015  . GERD (gastroesophageal reflux disease) 11/20/2015  . Loose stools 11/20/2015  . Cervical radiculopathy 12/28/2013  . Headache, migraine 12/03/2013    History obtained from: chart review and patient.  Melanie Brown was referred by Melanie Noble, MD.     Melanie Brown is a 66 y.o. female presenting for a follow up visit. She was last seen in November 2017. At that time, she was doing well with delivery 200/5 two puffs in the morning and two puffs at night . She does have coexisting aspirin exacerbated respiratory disease, and we have offered desensitization in the past. She has Qvar which she adds with respiratory flares. She is currently on Xolair 225mg  every 2 weeks. For her allergies, we continued her on Singulair 10 mg as well as cetirizine 10 mg. We gave her a sample of Dymista at the last visit. Because of insurance coverage, she was changed from [facility] to Symbicort beginning of the year.  Since last visit, she has not done well. Melanie Brown was replaced with Symbicort due to insurance changes. However  she has not tolerated Symbicort in the past due to elevated blood pressures. She never started using the Symbicort because of this. She continues on the [place] and Qvar. She has not gone without the [facility], as she had a sample from [place] and had one last inhaler from 2017. She has used Advair in the past and did fine with that. She is unsure whether. She  feels stable from an asthma perspective. She uses her rescue inhaler once per day which is baseline for her. She is interested in coming off of her Xolair (will be four years in May or so), however even now she is one week past due and can definitely feel it. She has had no ED visits or prednisone since the last visit. She last required prednisone in November October 2017.   From an allergic rhinitis perspective, she has done well. She is on allergic rhinitis as well as Dymista two sprays per nostril 1-2 times daily.   Otherwise, there have been no changes to her past medical history, surgical history, family history, or social history.    Review of Systems: a 14-point review of systems is pertinent for what is mentioned in HPI.  Otherwise, all other systems were negative. Constitutional: negative other than that listed in the HPI Eyes: negative other than that listed in the HPI Ears, nose, mouth, throat, and face: negative other than that listed in the HPI Respiratory: negative other than that listed in the HPI Cardiovascular: negative other than that listed in the HPI Gastrointestinal: negative other than that listed in the HPI Genitourinary: negative other than that listed in the HPI Integument: negative other than that listed in the HPI Hematologic: negative other than that listed in the HPI Musculoskeletal: negative other than that listed in the HPI Neurological: negative other than that listed in the HPI Allergy/Immunologic: negative other than that listed in the HPI    Objective:   Blood pressure 130/90, pulse 85, temperature 97.7 F (36.5 C), temperature source Oral, resp. rate 18. There is no height or weight on file to calculate BMI.   Physical Exam:  General: Alert, interactive, in no acute distress. Pleasant obese female.  Eyes: No conjunctival injection present on the right, No conjunctival injection present on the left, PERRL bilaterally, No discharge on the right, No  discharge on the left and No Horner-Trantas dots present Ears: Right TM pearly gray with normal light reflex, Left TM pearly gray with normal light reflex, Right TM intact without perforation and Left TM intact without perforation.  Nose/Throat: External nose within normal limits and septum midline, turbinates edematous with clear discharge, post-pharynx mildly erythematous without cobblestoning in the posterior oropharynx. Tonsils 2+ without exudates Neck: Supple without thyromegaly. Lungs: Decreased breath sounds with expiratory wheezing bilaterally. No increased work of breathing. CV: Normal S1/S2, no murmurs. Capillary refill <2 seconds.  Skin: Warm and dry, without lesions or rashes. Neuro:   Grossly intact. No focal deficits appreciated. Responsive to questions.   Diagnostic studies:  Spirometry: results abnormal (FEV1: 1.36/64%, FVC: 1.47/57%, FEV1/FVC: 92%).    Spirometry consistent with possible restrictive disease. Albuterol/Atrovent nebulizer treatment given in clinic with significant improvement. He FVC increased 850 mL (58%) and the FEV1 increased 290 mL (21%). She continued to have wheezing at the bases but was otherwise normal.   Allergy Studies: None    Salvatore Marvel, MD [facility] [place]

## 2017-02-01 NOTE — Telephone Encounter (Signed)
Emailed information of ASA desensitization to the patient.  Salvatore Marvel, MD [facility] [place]

## 2017-02-01 NOTE — Patient Instructions (Addendum)
1. Chronic allergic rhinitis - Continue with Singulair 10mg  daily and cetirizine 10mg  daily. - Continue with Dymista 2 sprays 1-2 times daily.  2. Severe persistent asthma, uncomplicated with coexisting aspirin exacerbated respiratory disease (Samter's Triad) - Continue with current regimen since that seems to be doing well. - Lung function looked good today.  - We can consider aspirin desensitization in the future if your asthma becomes uncontrollable.  - We will change to Advair 115/21 two puffs twice daily instead of Symbicort.  - Prescription for Pneumovax (pneumonia vaccination) provided.  - Daily controller medication(s): Advair 115/21 two puffs twice daily WITH SPACER + Xolair every two weeks - Rescue medications: ProAir 4 puffs every 4-6 hours as needed or albuterol nebulizer one vial puffs every 4-6 hours as needed - Changes during respiratory infections or worsening symptoms: add Qvar 4 puffs midday for TWO WEEKS WITH SPACER - Asthma control goals:  * Full participation in all desired activities (may need albuterol before activity) * Albuterol use two time or less a week on average (not counting use with activity) * Cough interfering with sleep two time or less a month * Oral steroids no more than once a year * No hospitalizations  3. Return in about 3 months.  Please inform us of any Emergency Department visits, hospitalizations, or changes in symptoms. Call us before going to the ED for breathing or allergy symptoms since we might be able to fit you in for a sick visit. Feel free to contact us anytime with any questions, problems, or concerns.  It was a pleasure to see you again today!   Websites that have reliable patient information: 1. American Academy of Asthma, Allergy, and Immunology: www.aaaai.org 2. Food Allergy Research and Education (FARE): foodallergy.org 3. Mothers of Asthmatics: http://www.asthmacommunitynetwork.org 4. American College of Allergy, Asthma, and  Immunology: www.acaai.org

## 2017-02-15 ENCOUNTER — Encounter: Payer: Self-pay | Admitting: Gastroenterology

## 2017-02-15 ENCOUNTER — Ambulatory Visit: Payer: BC Managed Care – PPO

## 2017-02-17 NOTE — Addendum Note (Signed)
Addended by: Lucrezia Starch I on: 02/17/2017 12:04 PM   Modules accepted: Orders

## 2017-02-22 ENCOUNTER — Ambulatory Visit (INDEPENDENT_AMBULATORY_CARE_PROVIDER_SITE_OTHER): Payer: BC Managed Care – PPO | Admitting: *Deleted

## 2017-02-22 ENCOUNTER — Other Ambulatory Visit: Payer: Self-pay | Admitting: *Deleted

## 2017-02-22 DIAGNOSIS — J4550 Severe persistent asthma, uncomplicated: Secondary | ICD-10-CM

## 2017-02-22 LAB — CBC WITH DIFFERENTIAL/PLATELET
BASOS PCT: 1 %
Basophils Absolute: 71 cells/uL (ref 0–200)
EOS PCT: 15 %
Eosinophils Absolute: 1065 cells/uL — ABNORMAL HIGH (ref 15–500)
HCT: 43 % (ref 35.0–45.0)
Hemoglobin: 14.1 g/dL (ref 11.7–15.5)
Lymphocytes Relative: 33 %
Lymphs Abs: 2343 cells/uL (ref 850–3900)
MCH: 30.9 pg (ref 27.0–33.0)
MCHC: 32.8 g/dL (ref 32.0–36.0)
MCV: 94.1 fL (ref 80.0–100.0)
MONOS PCT: 11 %
MPV: 10.2 fL (ref 7.5–12.5)
Monocytes Absolute: 781 cells/uL (ref 200–950)
NEUTROS ABS: 2840 {cells}/uL (ref 1500–7800)
Neutrophils Relative %: 40 %
PLATELETS: 335 10*3/uL (ref 140–400)
RBC: 4.57 MIL/uL (ref 3.80–5.10)
RDW: 14.1 % (ref 11.0–15.0)
WBC: 7.1 10*3/uL (ref 3.8–10.8)

## 2017-02-22 MED ORDER — FLUTICASONE PROPIONATE HFA 110 MCG/ACT IN AERO: 2.0000 | INHALATION_SPRAY | Freq: Two times a day (BID) | RESPIRATORY_TRACT | 5 refills | Status: DC

## 2017-02-24 NOTE — Progress Notes (Signed)
Actually we just talked with the Nucala rep today and she shared some compelling study results that the best responders to Nucala were the ones with the higher absolute eosinophil counts. Maybe we should just go ahead with the Nucala instead of the [place] for that reason. Melanie Brown is already used to coming monthly for the Xolair injections, so let's do ahead and do the Nucala.  Thanks, Salvatore Marvel, MD [facility] [place]

## 2017-03-08 ENCOUNTER — Ambulatory Visit (INDEPENDENT_AMBULATORY_CARE_PROVIDER_SITE_OTHER): Payer: BC Managed Care – PPO | Admitting: *Deleted

## 2017-03-08 DIAGNOSIS — J4550 Severe persistent asthma, uncomplicated: Secondary | ICD-10-CM | POA: Diagnosis not present

## 2017-03-22 ENCOUNTER — Ambulatory Visit (INDEPENDENT_AMBULATORY_CARE_PROVIDER_SITE_OTHER): Payer: BC Managed Care – PPO | Admitting: *Deleted

## 2017-03-22 DIAGNOSIS — J4550 Severe persistent asthma, uncomplicated: Secondary | ICD-10-CM | POA: Diagnosis not present

## 2017-03-22 MED ORDER — MEPOLIZUMAB 100 MG ~~LOC~~ SOLR
100.0000 mg | SUBCUTANEOUS | Status: DC
  Administered 2017-03-22 – 2017-12-06 (×10): 100 mg via SUBCUTANEOUS

## 2017-03-22 NOTE — Progress Notes (Signed)
Immunotherapy   Patient Details  Name: Melanie Brown MRN: [id number] Date of Birth: [birth year]  03/22/2017  [name]:Patient started Nucala 100 mg injection today. Lot [id number] Expiration 04/2018 given Rt arm subq   Frequency:Every 28 days Epi-Pen:Patient does have Epipen and has been instructed on proper use. Consent signed and patient instructions given.   Patient waited 60 minutes after injection and no reaction noted.   Maree Erie 03/22/2017, 4:19 PM

## 2017-04-10 ENCOUNTER — Encounter: Payer: Self-pay | Admitting: Allergy & Immunology

## 2017-04-18 ENCOUNTER — Other Ambulatory Visit: Payer: Self-pay | Admitting: Gastroenterology

## 2017-04-19 ENCOUNTER — Ambulatory Visit (INDEPENDENT_AMBULATORY_CARE_PROVIDER_SITE_OTHER): Payer: BC Managed Care – PPO | Admitting: *Deleted

## 2017-04-19 DIAGNOSIS — J4550 Severe persistent asthma, uncomplicated: Secondary | ICD-10-CM

## 2017-04-23 ENCOUNTER — Other Ambulatory Visit: Payer: Self-pay | Admitting: Allergy & Immunology

## 2017-05-17 ENCOUNTER — Ambulatory Visit (INDEPENDENT_AMBULATORY_CARE_PROVIDER_SITE_OTHER): Payer: BC Managed Care – PPO | Admitting: *Deleted

## 2017-05-17 DIAGNOSIS — J4550 Severe persistent asthma, uncomplicated: Secondary | ICD-10-CM | POA: Diagnosis not present

## 2017-06-14 ENCOUNTER — Ambulatory Visit (INDEPENDENT_AMBULATORY_CARE_PROVIDER_SITE_OTHER): Payer: BC Managed Care – PPO | Admitting: *Deleted

## 2017-06-14 DIAGNOSIS — J4550 Severe persistent asthma, uncomplicated: Secondary | ICD-10-CM | POA: Diagnosis not present

## 2017-07-05 ENCOUNTER — Ambulatory Visit: Payer: BC Managed Care – PPO

## 2017-07-12 ENCOUNTER — Ambulatory Visit: Payer: BC Managed Care – PPO

## 2017-07-12 ENCOUNTER — Ambulatory Visit (INDEPENDENT_AMBULATORY_CARE_PROVIDER_SITE_OTHER): Payer: BC Managed Care – PPO

## 2017-07-12 DIAGNOSIS — J4550 Severe persistent asthma, uncomplicated: Secondary | ICD-10-CM

## 2017-07-19 ENCOUNTER — Other Ambulatory Visit: Payer: Self-pay

## 2017-07-19 MED ORDER — NEBULIZER DEVI: 0 refills | Status: AC

## 2017-07-23 ENCOUNTER — Other Ambulatory Visit: Payer: Self-pay | Admitting: Allergy & Immunology

## 2017-08-04 ENCOUNTER — Other Ambulatory Visit: Payer: Self-pay | Admitting: Allergy & Immunology

## 2017-08-09 ENCOUNTER — Ambulatory Visit (INDEPENDENT_AMBULATORY_CARE_PROVIDER_SITE_OTHER): Payer: BC Managed Care – PPO | Admitting: *Deleted

## 2017-08-09 DIAGNOSIS — J4550 Severe persistent asthma, uncomplicated: Secondary | ICD-10-CM | POA: Diagnosis not present

## 2017-08-11 ENCOUNTER — Other Ambulatory Visit: Payer: Self-pay | Admitting: *Deleted

## 2017-08-11 MED ORDER — FLUTICASONE-SALMETEROL 115-21 MCG/ACT IN AERO: 2.0000 | INHALATION_SPRAY | Freq: Two times a day (BID) | RESPIRATORY_TRACT | 5 refills | Status: DC

## 2017-09-01 ENCOUNTER — Other Ambulatory Visit (HOSPITAL_COMMUNITY): Payer: Self-pay | Admitting: Obstetrics and Gynecology

## 2017-09-01 DIAGNOSIS — Z1231 Encounter for screening mammogram for malignant neoplasm of breast: Secondary | ICD-10-CM

## 2017-09-03 ENCOUNTER — Other Ambulatory Visit: Payer: Self-pay | Admitting: Allergy & Immunology

## 2017-09-06 ENCOUNTER — Ambulatory Visit (INDEPENDENT_AMBULATORY_CARE_PROVIDER_SITE_OTHER): Payer: BC Managed Care – PPO | Admitting: *Deleted

## 2017-09-06 DIAGNOSIS — J4550 Severe persistent asthma, uncomplicated: Secondary | ICD-10-CM

## 2017-09-07 ENCOUNTER — Ambulatory Visit (HOSPITAL_COMMUNITY)
Admission: RE | Admit: 2017-09-07 | Discharge: 2017-09-07 | Disposition: A | Discharge status: Home / Self Care | Payer: BC Managed Care – PPO | Source: Ambulatory Visit | Attending: Obstetrics and Gynecology | Admitting: Obstetrics and Gynecology

## 2017-09-07 DIAGNOSIS — Z1231 Encounter for screening mammogram for malignant neoplasm of breast: Secondary | ICD-10-CM | POA: Diagnosis not present

## 2017-09-12 ENCOUNTER — Other Ambulatory Visit: Payer: Self-pay | Admitting: Allergy & Immunology

## 2017-09-20 ENCOUNTER — Ambulatory Visit (INDEPENDENT_AMBULATORY_CARE_PROVIDER_SITE_OTHER): Payer: BC Managed Care – PPO | Admitting: Allergy & Immunology

## 2017-09-20 ENCOUNTER — Encounter: Payer: Self-pay | Admitting: Allergy & Immunology

## 2017-09-20 VITALS — BP 128/78 | HR 72 | Resp 18

## 2017-09-20 DIAGNOSIS — Z8709 Personal history of other diseases of the respiratory system: Secondary | ICD-10-CM | POA: Diagnosis not present

## 2017-09-20 DIAGNOSIS — J302 Other seasonal allergic rhinitis: Secondary | ICD-10-CM | POA: Diagnosis not present

## 2017-09-20 DIAGNOSIS — J339 Nasal polyp, unspecified: Secondary | ICD-10-CM

## 2017-09-20 DIAGNOSIS — J45909 Unspecified asthma, uncomplicated: Secondary | ICD-10-CM | POA: Diagnosis not present

## 2017-09-20 DIAGNOSIS — Z789 Other specified health status: Secondary | ICD-10-CM

## 2017-09-20 DIAGNOSIS — Z886 Allergy status to analgesic agent status: Secondary | ICD-10-CM

## 2017-09-20 DIAGNOSIS — J3089 Other allergic rhinitis: Secondary | ICD-10-CM

## 2017-09-20 DIAGNOSIS — J4550 Severe persistent asthma, uncomplicated: Secondary | ICD-10-CM

## 2017-09-20 MED ORDER — FLUTICASONE PROPIONATE 93 MCG/ACT NA EXHU: 2.0000 | INHALANT_SUSPENSION | Freq: Every day | NASAL | 3 refills | Status: DC

## 2017-09-20 MED ORDER — TIOTROPIUM BROMIDE MONOHYDRATE 1.25 MCG/ACT IN AERS: 2.0000 | INHALATION_SPRAY | Freq: Every day | RESPIRATORY_TRACT | 3 refills | Status: DC

## 2017-09-20 NOTE — Progress Notes (Signed)
FOLLOW UP  Date of Service/Encounter:  09/20/17   Assessment:   Severe persistent asthma without complication  History of nasal polyposis  Seasonal and perennial allergic rhinitis  Aspirin intolerance  Samter's triad   Asthma Reportables:  Severity: severe persistent  Risk: high Control: not well controlled   Plan/Recommendations:   1. Chronic allergic rhinitis - Continue with Singulair 10mg  daily and cetirizine 10mg  daily. - Stop Dymista and start Xhance two sprays per nostril daily.  - [facility] will contact you so that they can send you a two month supply free of charge.  - Check out the website for instructions on use: https://www.DealExplorer.be - If you are having trouble, bring your nasal spray by the office and we can demonstrate for you.   2. Severe persistent asthma, uncomplicated with coexisting aspirin exacerbated respiratory disease (Samter's Triad) - Lung function looked stable, but not great, today.  - We will add on Spiriva to see if this can help your symptoms.  - We will continue with the Nucala since this seems to be helping. - If your lung function is not better at the next visit, we will consider changing to [place] instead of Nucala.  - Daily controller medication(s): Advair 115/21 two puffs twice daily WITH SPACER + Spiriva two puffs once daily + Nucala every month - Rescue medications: ProAir 4 puffs every 4-6 hours as needed or albuterol nebulizer one vial puffs every 4-6 hours as needed - Changes during respiratory infections or worsening symptoms: add Qvar 4 puffs midday for TWO WEEKS WITH SPACER - Asthma control goals:  * Full participation in all desired activities (may need albuterol before activity) * Albuterol use two time or less a week on average (not counting use with activity) * Cough interfering with sleep two time or less a month * Oral steroids no more than once a year * No hospitalizations  3. Return in about 3 months  (around 12/21/2017).  Subjective:   Melanie Brown is a 66 y.o. female presenting today for follow up of  Chief Complaint  Patient presents with  . Asthma    Increased coughing over past 2 days.    . Allergic Rhinitis     Melanie Brown has a history of the following: Patient Active Problem List   Diagnosis Date Noted  . Severe persistent asthma without complication 32/35/5732  . Samter's triad 11/09/2016  . Chronic nonseasonal allergic rhinitis due to fungal spores 11/09/2016  . Aspirin intolerance 11/09/2016  . History of nasal polyposis 11/09/2016  . Dysphagia   . Change in bowel habits 11/20/2015  . Rectal bleeding 11/20/2015  . GERD (gastroesophageal reflux disease) 11/20/2015  . Loose stools 11/20/2015  . Cervical radiculopathy 12/28/2013  . Headache, migraine 12/03/2013    History obtained from: chart review and patient.  [facility] Primary Care Provider is [name], MD.     Melanie Brown is a 66 y.o. female presenting for a follow up visit. She was last seen in February 2018. At that time, her lung function continued to look fairly good, but we changed her from Symbicort to Advair. She had previously been stable on Dulera, but this was not covered by her insurance. We changed her to Symbicort, but she has a history of elevated blood pressure to Symbicort, necessitating the change to Advair. She was on Xolair for nearly four years, but was having some breakthrough exacerbations with prednisone required twice in the fall of 2017. She has a history of  AERD, which is why we finally decided to change to change to Nucala due to a history of elevated eosinophils (AEC 1065 in March 2018).   Since the last visit, she reports that she has actually done well. She has not required any prednisone since fall 2017, prior to when she started Nucala. She does feel that the Nucala has helped with her shortness of breath. She remains on the Advair 115/21 two puffs twice daily. She has  actually essentially stopped taking montelukast.because she kept forgetting to take it. She does report shortness of breath around 2-3 times per week, especially with increased physical activity. Overall this has become more of a problem, but it should be noted that she has gained 25 pounds in the last year due to depression and decreased activity in general following a coccyx injury.   Allergic rhinitis seems well controlled on Dysmita 1-2 times daily as well as cetirizine 10mg  daily with montelukast 10mg  daily. She has not been seen by ENT in several years (was followed by Dr. Redmond Baseman), at which time she had a nasal polypectomy. She does not think that her polyps have grown back, but she does admit that she has not been seen by ENT in more than 2-3 years.  Melanie Brown is planing to pursue bariatric surgery. She has done some research and is seriously considering pursuing the gastric sleeve. She has not talked to her PCP about this yet, but she is planning to talk to her gastroenterologist, Dr. Barney Drain.   Otherwise, there have been no changes to her past medical history, surgical history, family history, or social history. She is working part time at a [employer]. She recently went up with her daughter and her daughter's family and her daughter's sons to [place] to see her own son, who lives in [place], [place] with her granddaughters and daughter-in-law. Her past two years have been complicated by the death of her mother within the last year as well as the death of her former mother-in-law, with whom she was very close.   Review of Systems: a 14-point review of systems is pertinent for what is mentioned in HPI.  Otherwise, all other systems were negative. Constitutional: negative other than that listed in the HPI Eyes: negative other than that listed in the HPI Ears, nose, mouth, throat, and face: negative other than that listed in the HPI Respiratory: negative other than that listed in the  HPI Cardiovascular: negative other than that listed in the HPI Gastrointestinal: negative other than that listed in the HPI Genitourinary: negative other than that listed in the HPI Integument: negative other than that listed in the HPI Hematologic: negative other than that listed in the HPI Musculoskeletal: negative other than that listed in the HPI Neurological: negative other than that listed in the HPI Allergy/Immunologic: negative other than that listed in the HPI    Objective:   Blood pressure 128/78, pulse 72, resp. rate 18, SpO2 96 %. There is no height or weight on file to calculate BMI.   Physical Exam:  General: Alert, interactive, in no acute distress. Pleasant obese female, but seems somewhat more subdued today.  Eyes: No conjunctival injection present on the right, No conjunctival injection present on the left, PERRL bilaterally, No discharge on the right, No discharge on the left and No Horner-Trantas dots present Ears: Right TM pearly gray with normal light reflex, Left TM pearly gray with normal light reflex, Right TM intact without perforation and Left TM intact without perforation.  There is a slight amount of wax bilaterally but this did not obstruct the view of the tympanic membranes.  Nose/Throat: External nose within normal limits and septum midline, turbinates edematous with clear discharge, post-pharynx mildly erythematous without cobblestoning in the posterior oropharynx. Tonsils 2+ without exudates Neck: Supple without thyromegaly. Lungs: Decreased breath sounds with expiratory wheezing bilaterally. No increased work of breathing. CV: Normal S1/S2, no murmurs. Capillary refill <2 seconds.  Skin: Warm and dry, without lesions or rashes. Neuro: Grossly intact. No focal deficits appreciated. Responsive to questions.    Diagnostic studies:  Spirometry: results abnormal (FEV1: 1.33/62%, FVC: 1.52/59%, FEV1/FVC: 87%).    Spirometry consistent with possible  restrictive disease. Albuterol/Atrovent nebulizer treatment given in clinic with significant improvement in FEV1 and FVC per ATS criteria.  Allergy Studies: none      Salvatore Marvel, MD [facility]

## 2017-09-20 NOTE — Patient Instructions (Addendum)
1. Chronic allergic rhinitis - Continue with Singulair 10mg  daily and cetirizine 10mg  daily. - Stop Dymista and start Xhance two sprays per nostril daily.  - [facility] will contact you so that they can send you a two month supply free of charge.  - Check out the website for instructions on use: [email or web address]://www.DealExplorer.be - If you are having trouble, bring your nasal spray by the office and we can demonstrate for you.   2. Severe persistent asthma, uncomplicated with coexisting aspirin exacerbated respiratory disease (Samter's Triad) - Lung function looked stable, but not great, today.  - We will add on Spiriva to see if this can help your symptoms.  - We will continue with the Nucala since this seems to be helping. - If your lung function is not better at the next visit, we will consider changing to [place] instead of Nucala.  - Daily controller medication(s): Advair 115/21 two puffs twice daily WITH SPACER + Spiriva two puffs once daily + Nucala every month - Rescue medications: ProAir 4 puffs every 4-6 hours as needed or albuterol nebulizer one vial puffs every 4-6 hours as needed - Changes during respiratory infections or worsening symptoms: add Qvar 4 puffs midday for TWO WEEKS WITH SPACER - Asthma control goals:  * Full participation in all desired activities (may need albuterol before activity) * Albuterol use two time or less a week on average (not counting use with activity) * Cough interfering with sleep two time or less a month * Oral steroids no more than once a year * No hospitalizations  3. Return in about 3 months (around 12/21/2017).  Please inform us of any Emergency Department visits, hospitalizations, or changes in symptoms. Call us before going to the ED for breathing or allergy symptoms since we might be able to fit you in for a sick visit. Feel free to contact us anytime with any questions, problems, or concerns.  It was a pleasure to see you again today! Enjoy the  upcoming fall season!  Websites that have reliable patient information: 1. American Academy of Asthma, Allergy, and Immunology: www.aaaai.org 2. Food Allergy Research and Education (FARE): foodallergy.org 3. Mothers of Asthmatics: http://www.asthmacommunitynetwork.org 4. American College of Allergy, Asthma, and Immunology: www.acaai.org   Election Day is coming up on Tuesday, November 6th! Make your voice heard! Register to vote at vote.org!

## 2017-10-01 ENCOUNTER — Other Ambulatory Visit: Payer: Self-pay | Admitting: Allergy & Immunology

## 2017-10-04 ENCOUNTER — Ambulatory Visit (INDEPENDENT_AMBULATORY_CARE_PROVIDER_SITE_OTHER): Payer: BC Managed Care – PPO | Admitting: *Deleted

## 2017-10-04 DIAGNOSIS — J4550 Severe persistent asthma, uncomplicated: Secondary | ICD-10-CM

## 2017-10-16 ENCOUNTER — Other Ambulatory Visit: Payer: Self-pay | Admitting: Allergy & Immunology

## 2017-11-01 ENCOUNTER — Ambulatory Visit (INDEPENDENT_AMBULATORY_CARE_PROVIDER_SITE_OTHER): Payer: BC Managed Care – PPO

## 2017-11-01 DIAGNOSIS — J4550 Severe persistent asthma, uncomplicated: Secondary | ICD-10-CM

## 2017-11-15 ENCOUNTER — Ambulatory Visit: Payer: BC Managed Care – PPO | Admitting: Gastroenterology

## 2017-11-15 ENCOUNTER — Encounter: Payer: Self-pay | Admitting: Gastroenterology

## 2017-11-15 DIAGNOSIS — K529 Noninfective gastroenteritis and colitis, unspecified: Secondary | ICD-10-CM

## 2017-11-15 MED ORDER — RIFAXIMIN 550 MG PO TABS: 550.0000 mg | ORAL_TABLET | Freq: Three times a day (TID) | ORAL | 0 refills | Status: AC

## 2017-11-15 NOTE — Progress Notes (Signed)
Referring Provider: [name], MD Primary Care Physician:  [name], MD Primary GI: Dr. Oneida Alar   Chief Complaint  Patient presents with  . Gastroesophageal Reflux    f/u, doing okay  . Diarrhea    HPI:   Melanie Brown is a 66 y.o. female presenting today with a history of likely IBS-D, with colonoscopy/EGD completed Dec 2016. Needs surveillance colonoscopy in 2019 with MAC. Historically had been on Viberzi but unable to take this due to post-cholecystectomy state.   She notes that since retiring, she has had issues with her bowel habits. Occasional incontinence due to fecal urgency. Raw at rectum due to seepage. Associated abdominal spasms. No rectal bleeding. No weight loss. Usually about 5 stools a day, which has been her baseline for quite some time. Chronic diarrhea for years.    Past Medical History:  Diagnosis Date  . Anxiety   . Asthma   . Fibromyalgia   . Headache(784.0)   . IBS (irritable bowel syndrome)     Past Surgical History:  Procedure Laterality Date  . ABDOMINAL HYSTERECTOMY    . CESAREAN SECTION    . CHOLECYSTECTOMY    . COLONOSCOPY N/A 12/17/2015   Dr. Oneida Alar: multiple colon polyps, tubular adenomas and hyperplastic, sigmoid colon and descending colon diverticulosis, benign colonic mucosa on path, hemorrhoids. Surveillance in 2019 with overtube and MAC   . ESOPHAGOGASTRODUODENOSCOPY N/A 12/17/2015   Dr. Oneida Alar: multiple small gastric polyps, mild non-erosive gastritis, small hiatal hernia, negative celiac sprue  . SAVORY DILATION N/A 12/17/2015   Procedure: SAVORY DILATION;  Surgeon: Danie Binder, MD;  [facility]: [facility];  Service: Endoscopy;  Laterality: N/A;  . SINUS ENDO WITH FUSION  08/09/2012   Procedure: SINUS ENDO WITH FUSION;  Surgeon: Melida Quitter, MD;  Location: [facility];  Service: ENT;  Laterality: Bilateral;  Bilateral Frontal Recess Exploration  . SPHENOIDECTOMY  08/09/2012   Procedure: SPHENOIDECTOMY;  Surgeon: Melida Quitter, MD;   Location: [facility];  Service: ENT;  Laterality: Bilateral;  . TONSILLECTOMY      Current Outpatient Medications  Medication Sig Dispense Refill  . ADVAIR HFA 115-21 MCG/ACT inhaler TAKE 2 PUFFS BY MOUTH TWICE A DAY - NEED MD APPT 12 g 3  . albuterol (PROVENTIL HFA;VENTOLIN HFA) 108 (90 Base) MCG/ACT inhaler Inhale into the lungs every 6 (six) hours as needed for wheezing or shortness of breath.    . cetirizine (ZYRTEC) 10 MG tablet Take 10 mg by mouth daily.    Marland Kitchen DYMISTA 137-50 MCG/ACT SUSP PLACE 2 SPRAYS INTO THE NOSE DAILY. 23 g 0  . esomeprazole (NEXIUM) 40 MG capsule TAKE ONE CAPSULE BY MOUTH EVERY DAY BEFORE BREAKFAST 90 capsule 3  . ipratropium-albuterol (DUONEB) 0.5-2.5 (3) MG/3ML SOLN Take 3 mLs by nebulization every 4 (four) hours as needed. 75 mL 3  . Multiple Vitamin (MULTIVITAMIN) capsule Take 1 capsule by mouth daily. Reported on 02/24/2016    . Respiratory Therapy Supplies (NEBULIZER) DEVI Use As Directed 1 each 0  . SUMAtriptan (IMITREX) 100 MG tablet Take 100 mg by mouth daily as needed.  6  . Tiotropium Bromide Monohydrate (SPIRIVA RESPIMAT) 1.25 MCG/ACT AERS Inhale 2 puffs into the lungs daily. 4 g 3  . budesonide-formoterol (SYMBICORT) 160-4.5 MCG/ACT inhaler Inhale 2 puffs into the lungs 2 (two) times daily. (Patient not taking: Reported on 02/01/2017) 1 Inhaler 6   Current Facility-Administered Medications  Medication Dose Route Frequency Provider Last Rate Last Dose  . Mepolizumab SOLR 100 mg  100 mg Subcutaneous Q28 days Valentina Shaggy, MD   100 mg at 11/01/17 0940    Allergies as of 11/15/2017 - Review Complete 11/15/2017  Allergen Reaction Noted  . Aspirin Shortness Of Breath and Rash 05/23/2008  . Chloramphenicols Shortness Of Breath 12/28/2013  . Procaine hcl Other (See Comments) 05/23/2008  . Penicillins Rash 05/23/2008    Family History  Problem Relation Age of Onset  . Colon cancer Father 24       COLON RESECTED  . Colon polyps Neg Hx   . Allergic  rhinitis Neg Hx   . Angioedema Neg Hx   . Asthma Neg Hx   . Atopy Neg Hx   . Eczema Neg Hx   . Immunodeficiency Neg Hx   . Urticaria Neg Hx     Social History   Socioeconomic History  . Marital status: Single    Spouse name: None  . Number of children: 2  . Years of education: college  . Highest education level: None  Social Needs  . Financial resource strain: None  . Food insecurity - worry: None  . Food insecurity - inability: None  . Transportation needs - medical: None  . Transportation needs - non-medical: None  Occupational History  . Occupation: [employer]    Employer: [employer]  Tobacco Use  . Smoking status: Never Smoker  . Smokeless tobacco: Never Used  Substance and Sexual Activity  . Alcohol use: No  . Drug use: No  . Sexual activity: None  Other Topics Concern  . None  Social History Narrative  . None    Review of Systems: Gen: Denies fever, chills, anorexia. Denies fatigue, weakness, weight loss.  CV: Denies chest pain, palpitations, syncope, peripheral edema, and claudication. Resp: Denies dyspnea at rest, cough, wheezing, coughing up blood, and pleurisy. GI: see HPI  Derm: Denies rash, itching, dry skin Psych: Denies depression, anxiety, memory loss, confusion. No homicidal or suicidal ideation.  Heme: Denies bruising, bleeding, and enlarged lymph nodes.  Physical Exam: BP (!) 173/92   Pulse 70   Temp (!) 97.1 F (36.2 C) (Oral)   Ht 5' 3"  (1.6 m)   Wt 269 lb (122 kg)   BMI 47.65 kg/m  General:   Alert and oriented. No distress noted. Pleasant and cooperative.  Head:  Normocephalic and atraumatic. Eyes:  Conjuctiva clear without scleral icterus. Mouth:  Oral mucosa pink and moist. Good dentition. No lesions. Abdomen:  +BS, soft, non-tender and non-distended. No rebound or guarding. No HSM or masses noted. Msk:  Symmetrical without gross deformities. Normal posture. Extremities:  Without edema. Neurologic:  Alert and   oriented x4 Psych:  Alert and cooperative. Normal mood and affect.

## 2017-11-15 NOTE — Patient Instructions (Signed)
I have sent in Xifaxan to take three times a day for 2 weeks. Please keep me updated with how you are doing (can send a message in myChart).  I will see you back in 6-8 weeks!  Have a wonderful holiday season!

## 2017-11-17 NOTE — Assessment & Plan Note (Signed)
66 year old female with history of chronic diarrhea, colonoscopy on file without evidence for microscopic colitis. No alarm features today. Will provide round of Xifaxan for IBS-D. If this is not successful, will need to consider Bentyl or Levsin for supportive measures. Weight is actually increased from prior visits. Patient noted interest in bariatric surgery, which she will consider. Return in 6-8 weeks.

## 2017-11-17 NOTE — Progress Notes (Signed)
cc'd to pcp 

## 2017-11-29 ENCOUNTER — Ambulatory Visit: Payer: Self-pay

## 2017-12-06 ENCOUNTER — Ambulatory Visit (INDEPENDENT_AMBULATORY_CARE_PROVIDER_SITE_OTHER): Payer: BC Managed Care – PPO

## 2017-12-06 DIAGNOSIS — J4550 Severe persistent asthma, uncomplicated: Secondary | ICD-10-CM | POA: Diagnosis not present

## 2017-12-18 ENCOUNTER — Encounter: Payer: Self-pay | Admitting: Gastroenterology

## 2017-12-18 ENCOUNTER — Encounter: Payer: Self-pay | Admitting: Allergy & Immunology

## 2017-12-19 ENCOUNTER — Other Ambulatory Visit: Payer: Self-pay | Admitting: Gastroenterology

## 2017-12-19 MED ORDER — DICYCLOMINE HCL 10 MG PO CAPS: 10.0000 mg | ORAL_CAPSULE | Freq: Three times a day (TID) | ORAL | 0 refills | Status: DC

## 2017-12-21 ENCOUNTER — Encounter: Payer: Self-pay | Admitting: Allergy & Immunology

## 2017-12-21 ENCOUNTER — Ambulatory Visit: Payer: BC Managed Care – PPO | Admitting: Allergy & Immunology

## 2017-12-21 VITALS — BP 120/72 | HR 74 | Temp 97.6°F | Resp 18

## 2017-12-21 DIAGNOSIS — J3089 Other allergic rhinitis: Secondary | ICD-10-CM | POA: Diagnosis not present

## 2017-12-21 DIAGNOSIS — J45909 Unspecified asthma, uncomplicated: Secondary | ICD-10-CM

## 2017-12-21 DIAGNOSIS — J4551 Severe persistent asthma with (acute) exacerbation: Secondary | ICD-10-CM

## 2017-12-21 DIAGNOSIS — J339 Nasal polyp, unspecified: Secondary | ICD-10-CM

## 2017-12-21 DIAGNOSIS — Z886 Allergy status to analgesic agent status: Secondary | ICD-10-CM | POA: Diagnosis not present

## 2017-12-21 MED ORDER — MONTELUKAST SODIUM 10 MG PO TABS
10.0000 mg | ORAL_TABLET | Freq: Every day | ORAL | 5 refills | Status: DC
Start: 2017-12-21 — End: 2018-11-02

## 2017-12-21 NOTE — Progress Notes (Signed)
FOLLOW UP  Date of Service/Encounter:  12/21/17   Assessment:   Severe persistent asthma without complication  History of nasal polyposis  Perennial allergic rhinitis (molds)  Aspirin intolerance  Samter's triad   Asthma Reportables:  Severity: severe persistent  Risk: high Control: not well controlled  Plan/Recommendations:   1. Chronic allergic rhinitis (molds) - Continue with Singulair 10mg  daily and cetirizine 10mg  daily. 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  - Sample and demonstration provided today.  - We could consider the addition of allergen immunotherapy in the future as a means of controlling her multisystemic atopic disease.   - We would likely need to obtain repeat allergy testing (skin or sIgE environmental allergy panel) since her last testing was positive only to mold mix #2 and equivocal to horse in May 2013.    2. Severe persistent asthma, uncomplicated with coexisting aspirin exacerbated respiratory disease (Samter's Triad) - Lung function looked worse today, but it did improve slightly with the nebulizer treatments.  - Review of her spirometry values from the last 18 months has shown a consistent decline overall. - Although the Nucala has helped Melanie Brown to avoid prednisone, her lung values continue to show a decline.  - We will work on changing you to [employer] instead of Nucala to see if this provides more relief.  - We will increase your Advair to 230/21 two puffs twice daily.  - We will continue Spiriva for now, although its benefit has not been clearly established in her clinical case.  - We will consider an ASA desensitization if there is no improvement with the [place].  - Daily controller medication(s): Advair 230/21 two puffs twice daily WITH SPACER + Spiriva two puffs once daily + Fasenra every month  - Rescue medications: ProAir 4 puffs every 4-6 hours as needed or albuterol nebulizer one vial  puffs every 4-6 hours as needed - Changes during respiratory infections or worsening symptoms: add Qvar 4 puffs midday for TWO WEEKS WITH SPACER - Asthma control goals:  * Full participation in all desired activities (may need albuterol before activity) * Albuterol use two time or less a week on average (not counting use with activity) * Cough interfering with sleep two time or less a month * Oral steroids no more than once a year * No hospitalizations - Here is my email address in case you need to contact me in the future (it goes directly to me): [email or web address]  3. Return in about 2 months (around 02/18/2018).  Subjective:   Melanie Brown is a 67 y.o. female presenting today for follow up of  Chief Complaint  Patient presents with  . Asthma    Increased flares  . Wheezing  . Cough    Productive cough/Had a cold 12/13/17.     Melanie Brown has a history of the following: Patient Active Problem List   Diagnosis Date Noted  . Chronic diarrhea 11/15/2017  . Severe persistent asthma without complication 62/95/2841  . Samter's triad 11/09/2016  . Chronic nonseasonal allergic rhinitis due to fungal spores 11/09/2016  . Aspirin intolerance 11/09/2016  . History of nasal polyposis 11/09/2016  . Dysphagia   . Change in bowel habits 11/20/2015  . Rectal bleeding 11/20/2015  . GERD (gastroesophageal reflux disease) 11/20/2015  . Loose stools 11/20/2015  . Cervical radiculopathy 12/28/2013  . Headache, migraine 12/03/2013    History obtained from: chart review and patient.  [place] Primary  Care Provider is [name], MD.     Melanie Brown is a 67 y.o. female presenting for a follow up visit. She was last seen in October 2018. At that time, her lung function looked slightly declined compared to previous visits. We did not make changes since she had actually done fairly well on the Nucala with no prednisone required since fall 2017. However, she was endorsing  shortness of breath that had been worsening since her trip to [place] in August 2018. Her allergic rhinitis was well controlled with Dymista 1-2 sprays per nostril daily as well as cetirizine 10mg  daily with montelukast 10mg  daily.    but we changed her from Symbicort to Advair. She had previously been stable on Dulera, but this was not covered by her insurance. We changed her to Symbicort, but she has a history of elevated blood pressure to Symbicort, necessitating the change to Advair. She was on Xolair for nearly four years, but was having some breakthrough exacerbations with prednisone required twice in the fall of 2017. She has a history of AERD, which is why we finally decided to change to change to Nucala due to a history of elevated eosinophils (AEC 1065 in March 2018).   Since the last visit, she has mostly done worse. She has had ongoing issues since September. During Thanksgiving and Christmas. She has sounded quite squeaky today. Overall her symptoms have worsened since September. She has not gone to Urgent Care or the ED. Peppermint diffusion helps to open her up and breathe better.   She is using CBD oil on her monthly massages for her fibromyalgia. She does not use any pain medications at all. She does have Imitrex to use as needed for migraines. She uses coffee for pain as well as massages and CBD oil. She does use other essential oils.  She does have a history of GERD and is followed by Dr. Barney Drain. She is planning to undergo bariatric surgery in the future, but is working on losing some more weight and prepping for the surgery. She has been gaining more weight recently with some depression which has been complicated by a coccyx injury.   She does have a history of nasal polyposis. She is status post polypectomy performed in 2013 by Dr. Redmond Baseman. She has not seen Dr. Redmond Baseman in a couple of years. She also has a history of aspirin sensitivity, which resulted in worsening of her breathing  status as well as a mild rash on her hands. We have discussed an aspirin desensitization for presumed AERD, but we have held off while we trialed biologics. She avoids all NSAIDs at this time.  Review of her asthma controllers in the past: Ruthe Mannan worked well, but insurance stopped covering it in 2017. We then changed to Symbicort, but this has led to elevated blood pressures in the past. Therefore we changed to Advair HFA. We changed her from [place], which she had been on for four years, to [place] in March 2018. We added Spiriva in October 2018.   Otherwise, there have been no changes to her past medical history, surgical history, family history, or social history. She continues to unpack her things, as she has been in a transition from moving from her house in with her daughter. 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 Her past two years have been complicated by the death of her mother within the last year as well as  the death of her former mother-in-law, with whom she was very close.     Review of Systems: a 14-point review of systems is pertinent for what is mentioned in HPI.  Otherwise, all other systems were negative.  Constitutional: negative other than that listed in the HPI Eyes: negative other than that listed in the HPI Ears, nose, mouth, throat, and face: negative other than that listed in the HPI Respiratory: negative other than that listed in the HPI Cardiovascular: negative other than that listed in the HPI Gastrointestinal: negative other than that listed in the HPI Genitourinary: negative other than that listed in the HPI Integument: negative other than that listed in the HPI Hematologic: negative other than that listed in the HPI Musculoskeletal: negative other than that listed in the HPI Neurological: negative other than that listed in the HPI Allergy/Immunologic: negative other than that listed in the  HPI    Objective:   Blood pressure 120/72, pulse 74, temperature 97.6 F (36.4 C), temperature source Oral, resp. rate 18, SpO2 95 %. There is no height or weight on file to calculate BMI.   Physical Exam:  General:Alert, interactive, in mild distress.Pleasant obese female. Eyes: No conjunctival injection present on the right, No conjunctival injection present on the left, PERRL bilaterally, No discharge on the right, No discharge on the left and No Horner-Trantas dots present Ears: Right TM pearly gray with normal light reflex, Left TM pearly gray with normal light reflex, Right TM intact without perforation and Left TM intact without perforation. There is a slight amount of wax bilaterally but this did not obstruct the view of the tympanic membranes. Ears are normally set and rotated.  Nose/Throat: External nose within normal limits and septum midline, turbinates edematouswith clear discharge, post-pharynx mildly erythematouswithout cobblestoning in the posterior oropharynx. Tonsils 2+without exudates. No oropharyngeal swelling noted.  Neck: Supple without thyromegaly. Lungs: Decreased breath sounds with expiratory wheezing bilaterally. Markedly decreased air movement at the bases. Mildly increased work of breathing. CV: Normal S1/S2, no murmurs.Capillary refill <2 seconds.  Skin: Warm and dry, without lesions or rashes. Neuro: Grossly intact.No focal deficits appreciated. Responsive to questions.  Diagnostic studies:  Spirometry: results abnormal (FEV1: 1.08/51%, FVC: 1.52/60%, FEV1/FVC: 70%).    Spirometry consistent with possible restrictive disease. Xopenex/Atrovent nebulizer treatment given in clinic with significant improvement in FEV1 per ATS criteria. FEV1 increased 35% (384mL).  Date FVC FEV1 FEV1/FVC  12/22/2017 1.52 1.08 0.71  12/22/2017 1.63 1.46 0.90  09/20/2017 1.52 1.33 0.88  02/01/2017 1.47 1.36 0.93  02/01/2017 2.32 1.65 0.71  11/09/2016 1.93 1.72 0.89    10/20/2016 1.3 1.18 0.91  10/20/2016 1.58 1.45 0.92  06/29/2016 1.75 1.61 0.92  02/24/2016 1.86 1.71 0.92  02/24/2016 2.48 1.85 0.75      Allergy Studies: none    Salvatore Marvel, MD [place] [facility]

## 2017-12-21 NOTE — Patient Instructions (Addendum)
1. Chronic allergic rhinitis - Continue with Singulair 10mg  daily and cetirizine 10mg  daily. 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  - Sample and demonstration provided today.   2. Severe persistent asthma, uncomplicated with coexisting aspirin exacerbated respiratory disease (Samter's Triad) - Lung function looked worse today, but it did improve slightly with the nebulizer treatments.   - We will work on changing you to [place] to see if this provides more relief.  - We will increase your Advair to 230/21 two puffs twice daily. - We will continue Spiriva for now.  - Daily controller medication(s): Advair 230/21 two puffs twice daily WITH SPACER + Spiriva two puffs once daily + Fasenra every month  - Rescue medications: ProAir 4 puffs every 4-6 hours as needed or albuterol nebulizer one vial puffs every 4-6 hours as needed - Changes during respiratory infections or worsening symptoms: add Qvar 4 puffs midday for TWO WEEKS WITH SPACER - Asthma control goals:  * Full participation in all desired activities (may need albuterol before activity) * Albuterol use two time or less a week on average (not counting use with activity) * Cough interfering with sleep two time or less a month * Oral steroids no more than once a year * No hospitalizations - Here is my email address in case you need to contact me in the future (it goes directly to me): [email or web address]  3. Return in about 2 months (around 02/18/2018).   Please inform us of any Emergency Department visits, hospitalizations, or changes in symptoms. Call us before going to the ED for breathing or allergy symptoms since we might be able to fit you in for a sick visit. Feel free to contact us anytime with any questions, problems, or concerns.  It was a pleasure to see you again today! Happy New Year!   Websites that have reliable patient information: 1. American Academy of  Asthma, Allergy, and Immunology: www.aaaai.org 2. Food Allergy Research and Education (FARE): foodallergy.org 3. Mothers of Asthmatics: http://www.asthmacommunitynetwork.org 4. American College of Allergy, Asthma, and Immunology: www.acaai.org

## 2017-12-22 ENCOUNTER — Encounter: Payer: Self-pay | Admitting: Allergy & Immunology

## 2017-12-30 ENCOUNTER — Ambulatory Visit: Payer: BC Managed Care – PPO | Admitting: Gastroenterology

## 2017-12-30 ENCOUNTER — Encounter: Payer: Self-pay | Admitting: Gastroenterology

## 2017-12-30 VITALS — BP 145/77 | HR 69 | Temp 97.1°F | Ht 63.0 in | Wt 266.8 lb

## 2017-12-30 DIAGNOSIS — Z1159 Encounter for screening for other viral diseases: Secondary | ICD-10-CM | POA: Diagnosis not present

## 2017-12-30 DIAGNOSIS — K529 Noninfective gastroenteritis and colitis, unspecified: Secondary | ICD-10-CM

## 2017-12-30 NOTE — Patient Instructions (Signed)
I am glad you are doing well!  Continue Bentyl 20-30 minutes before meals and at bedtime. No more than 4 per day.  Please call me if any issues.  I will see you in 4 months.  I have ordered the screening test for Hepatitis C, so we can have this on file for you.

## 2017-12-30 NOTE — Assessment & Plan Note (Signed)
67 year old female with history of chronic diarrhea, no evidence for microscopic colitis on prior colonoscopy. Next due later this year. No improvement with Xifaxan, but she has noted improvement with Bentyl. No alarm features. Continue Bentyl and return in 4 months.

## 2017-12-30 NOTE — Progress Notes (Signed)
Referring Provider: [name], MD Primary Care Physician:  [name], MD Primary GI: Dr. Oneida Alar   Chief Complaint  Patient presents with  . Diarrhea    improved, controlled a little more    HPI:   Melanie Brown is a 67 y.o. female presenting today with a history of likely IBS-D, with colonoscopy/EGD completed Dec 2016. Needs surveillance colonoscopy in 2019 with MAC. Historically had been on Viberzi but unable to take this due to post-cholecystectomy state. Prescribed Xifaxan at last visit. No improvement noted with this. I sent in Bentyl for her to try. Here for follow-up.  She is taking Bentyl 3-4 times a day. Notes improvement overall with consistency and frequency. At times will still have urgency but feels this is more diet-related. No rectal bleeding. States she saw Dr. Willey Blade recently for a physical and "everything was good". She is still interested in bariatric surgery in the future but awaiting to see what her insurance will cover. She is requesting Hep C antibody screening.    Past Medical History:  Diagnosis Date  . Anxiety   . Asthma   . Fibromyalgia   . Headache(784.0)   . IBS (irritable bowel syndrome)     Past Surgical History:  Procedure Laterality Date  . ABDOMINAL HYSTERECTOMY    . CESAREAN SECTION    . CHOLECYSTECTOMY    . COLONOSCOPY N/A 12/17/2015   Dr. Oneida Alar: multiple colon polyps, tubular adenomas and hyperplastic, sigmoid colon and descending colon diverticulosis, benign colonic mucosa on path, hemorrhoids. Surveillance in 2019 with overtube and MAC   . ESOPHAGOGASTRODUODENOSCOPY N/A 12/17/2015   Dr. Oneida Alar: multiple small gastric polyps, mild non-erosive gastritis, small hiatal hernia, negative celiac sprue  . SAVORY DILATION N/A 12/17/2015   Procedure: SAVORY DILATION;  Surgeon: Danie Binder, MD;  [facility];  Service: Endoscopy;  Laterality: N/A;  . SINUS ENDO WITH FUSION  08/09/2012   Procedure: SINUS ENDO WITH FUSION;   Surgeon: Melida Quitter, MD;  Location: [facility];  Service: ENT;  Laterality: Bilateral;  Bilateral Frontal Recess Exploration  . SPHENOIDECTOMY  08/09/2012   Procedure: SPHENOIDECTOMY;  Surgeon: Melida Quitter, MD;  Location: [facility];  Service: ENT;  Laterality: Bilateral;  . TONSILLECTOMY      Current Outpatient Medications  Medication Sig Dispense Refill  . ADVAIR HFA 115-21 MCG/ACT inhaler TAKE 2 PUFFS BY MOUTH TWICE A DAY - NEED MD APPT 12 g 3  . albuterol (PROVENTIL HFA;VENTOLIN HFA) 108 (90 Base) MCG/ACT inhaler Inhale into the lungs every 6 (six) hours as needed for wheezing or shortness of breath.    . cetirizine (ZYRTEC) 10 MG tablet Take 10 mg by mouth daily.    Marland Kitchen dicyclomine (BENTYL) 10 MG capsule Take 1 capsule (10 mg total) by mouth 4 (four) times daily -  before meals and at bedtime. 120 capsule 0  . esomeprazole (NEXIUM) 40 MG capsule TAKE ONE CAPSULE BY MOUTH EVERY DAY BEFORE BREAKFAST 90 capsule 3  . ipratropium-albuterol (DUONEB) 0.5-2.5 (3) MG/3ML SOLN Take 3 mLs by nebulization every 4 (four) hours as needed. 75 mL 3  . montelukast (SINGULAIR) 10 MG tablet Take 1 tablet (10 mg total) by mouth at bedtime. 30 tablet 5  . Multiple Vitamin (MULTIVITAMIN) capsule Take 1 capsule by mouth daily. Reported on 02/24/2016    . PRESCRIPTION MEDICATION xchance nasal spray    . Respiratory Therapy Supplies (NEBULIZER) DEVI Use As Directed 1 each 0  . SUMAtriptan (IMITREX) 100 MG tablet Take  100 mg by mouth daily as needed.  6  . Tiotropium Bromide Monohydrate (SPIRIVA RESPIMAT) 1.25 MCG/ACT AERS Inhale 2 puffs into the lungs daily. 4 g 3  . budesonide-formoterol (SYMBICORT) 160-4.5 MCG/ACT inhaler Inhale 2 puffs into the lungs 2 (two) times daily. (Patient not taking: Reported on 02/01/2017) 1 Inhaler 6  . DYMISTA 137-50 MCG/ACT SUSP PLACE 2 SPRAYS INTO THE NOSE DAILY. (Patient not taking: Reported on 12/30/2017) 23 g 0   Current Facility-Administered Medications  Medication Dose Route Frequency  Provider Last Rate Last Dose  . Mepolizumab SOLR 100 mg  100 mg Subcutaneous Q28 days Valentina Shaggy, MD   100 mg at 12/06/17 4818    Allergies as of 12/30/2017 - Review Complete 12/30/2017  Allergen Reaction Noted  . Aspirin Shortness Of Breath and Rash 05/23/2008  . Chloramphenicols Shortness Of Breath 12/28/2013  . Procaine hcl Other (See Comments) 05/23/2008  . Penicillins Rash 05/23/2008    Family History  Problem Relation Age of Onset  . Colon cancer Father 93       COLON RESECTED  . Colon polyps Neg Hx   . Allergic rhinitis Neg Hx   . Angioedema Neg Hx   . Asthma Neg Hx   . Atopy Neg Hx   . Eczema Neg Hx   . Immunodeficiency Neg Hx   . Urticaria Neg Hx     Social History   Socioeconomic History  . Marital status: Single    Spouse name: None  . Number of children: 2  . Years of education: college  . Highest education level: None  Social Needs  . Financial resource strain: None  . Food insecurity - worry: None  . Food insecurity - inability: None  . Transportation needs - medical: None  . Transportation needs - non-medical: None  Occupational History  . Occupation: [employer] end [employer]    Employer: [employer]  Tobacco Use  . Smoking status: Never Smoker  . Smokeless tobacco: Never Used  Substance and Sexual Activity  . Alcohol use: No  . Drug use: No  . Sexual activity: None  Other Topics Concern  . None  Social History Narrative  . None    Review of Systems: Gen: Denies fever, chills, anorexia. Denies fatigue, weakness, weight loss.  CV: Denies chest pain, palpitations, syncope, peripheral edema, and claudication. Resp: Denies dyspnea at rest, cough, wheezing, coughing up blood, and pleurisy. GI: see HPI  Derm: Denies rash, itching, dry skin Psych: Denies depression, anxiety, memory loss, confusion. No homicidal or suicidal ideation.  Heme: Denies bruising, bleeding, and enlarged lymph nodes.  Physical Exam: BP (!) 145/77    Pulse 69   Temp (!) 97.1 F (36.2 C) (Oral)   Ht _0  (1.6 m)   Wt 266 lb 12.8 oz (121 kg)   BMI 47.26 kg/m  General:   Alert and oriented. No distress noted. Pleasant and cooperative.  Head:  Normocephalic and atraumatic. Eyes:  Conjuctiva clear without scleral icterus. Mouth:  Oral mucosa pink and moist.  Abdomen:  +BS, soft, non-tender and non-distended. No rebound or guarding. No HSM or masses noted. Msk:  Symmetrical without gross deformities. Normal posture. Extremities:  Without edema. Neurologic:  Alert and  oriented x4 Psych:  Alert and cooperative. Normal mood and affect.

## 2018-01-03 ENCOUNTER — Other Ambulatory Visit: Payer: Self-pay | Admitting: Gastroenterology

## 2018-01-03 ENCOUNTER — Ambulatory Visit (INDEPENDENT_AMBULATORY_CARE_PROVIDER_SITE_OTHER): Payer: BC Managed Care – PPO

## 2018-01-03 ENCOUNTER — Encounter: Payer: Self-pay | Admitting: Gastroenterology

## 2018-01-03 DIAGNOSIS — J4550 Severe persistent asthma, uncomplicated: Secondary | ICD-10-CM

## 2018-01-03 DIAGNOSIS — J4551 Severe persistent asthma with (acute) exacerbation: Secondary | ICD-10-CM

## 2018-01-03 DIAGNOSIS — K529 Noninfective gastroenteritis and colitis, unspecified: Secondary | ICD-10-CM

## 2018-01-03 MED ORDER — BENRALIZUMAB 30 MG/ML ~~LOC~~ SOSY
30.0000 mg | PREFILLED_SYRINGE | SUBCUTANEOUS | Status: DC
  Administered 2018-01-03 – 2018-10-11 (×7): 30 mg via SUBCUTANEOUS

## 2018-01-03 NOTE — Assessment & Plan Note (Signed)
Requesting screening. Lab ordered.

## 2018-01-03 NOTE — Progress Notes (Signed)
Immunotherapy   Patient Details  Name: Melanie Brown MRN: [id number] Date of Birth: 1951/11/20  01/03/2018  Melanie Brown started injections for  Berna Bue  She is to come every 4 weeks for three injections then every 8 weeks. She waited her 30 minutes without any problems. Epi-Pen:Epi-Pen Available  Consent signed and patient instructions given.   Jonette Eva 01/03/2018, 10:23 AM

## 2018-01-05 NOTE — Progress Notes (Signed)
CC'D TO PCP °

## 2018-01-07 LAB — T3, FREE: T3, Free: 3.1 pg/mL (ref 2.0–4.4)

## 2018-01-07 LAB — HEPATITIS C ANTIBODY: Hep C Virus Ab: <0.1 s/co ratio (ref 0.0–0.9)

## 2018-01-07 LAB — T4, FREE: Free T4: 1.24 ng/dL (ref 0.82–1.77)

## 2018-01-17 ENCOUNTER — Other Ambulatory Visit: Payer: Self-pay | Admitting: Gastroenterology

## 2018-01-24 ENCOUNTER — Encounter: Payer: Self-pay | Admitting: Allergy & Immunology

## 2018-01-24 MED ORDER — FLUTICASONE PROPIONATE 93 MCG/ACT NA EXHU: 2.0000 | INHALANT_SUSPENSION | Freq: Every day | NASAL | 3 refills | Status: DC

## 2018-01-27 ENCOUNTER — Encounter: Payer: Self-pay | Admitting: Gastroenterology

## 2018-01-27 ENCOUNTER — Other Ambulatory Visit: Payer: Self-pay | Admitting: Gastroenterology

## 2018-01-27 MED ORDER — DICYCLOMINE HCL 10 MG PO CAPS: 10.0000 mg | ORAL_CAPSULE | Freq: Three times a day (TID) | ORAL | 5 refills | Status: DC

## 2018-01-31 ENCOUNTER — Other Ambulatory Visit: Payer: Self-pay | Admitting: Allergy & Immunology

## 2018-01-31 ENCOUNTER — Ambulatory Visit (INDEPENDENT_AMBULATORY_CARE_PROVIDER_SITE_OTHER): Payer: BC Managed Care – PPO

## 2018-01-31 DIAGNOSIS — J4551 Severe persistent asthma with (acute) exacerbation: Secondary | ICD-10-CM

## 2018-01-31 DIAGNOSIS — J4550 Severe persistent asthma, uncomplicated: Secondary | ICD-10-CM

## 2018-02-08 ENCOUNTER — Other Ambulatory Visit: Payer: Self-pay | Admitting: Allergy & Immunology

## 2018-02-16 ENCOUNTER — Other Ambulatory Visit: Payer: Self-pay | Admitting: Allergy & Immunology

## 2018-02-28 ENCOUNTER — Ambulatory Visit (INDEPENDENT_AMBULATORY_CARE_PROVIDER_SITE_OTHER): Payer: BC Managed Care – PPO | Admitting: *Deleted

## 2018-02-28 DIAGNOSIS — J4550 Severe persistent asthma, uncomplicated: Secondary | ICD-10-CM | POA: Diagnosis not present

## 2018-02-28 DIAGNOSIS — J4540 Moderate persistent asthma, uncomplicated: Secondary | ICD-10-CM

## 2018-03-13 ENCOUNTER — Other Ambulatory Visit: Payer: Self-pay | Admitting: Allergy & Immunology

## 2018-04-03 ENCOUNTER — Telehealth: Payer: Self-pay

## 2018-04-03 MED ORDER — OMEPRAZOLE 40 MG PO CPDR: 40.0000 mg | DELAYED_RELEASE_CAPSULE | Freq: Every day | ORAL | 3 refills | Status: DC

## 2018-04-03 NOTE — Telephone Encounter (Signed)
PT is requesting Omeprazole 40 mg, she can get that for $15.00 amonth. She is paying $68.00 for esomeprazole 40 mg monthly. Forwarding to refill box.

## 2018-04-03 NOTE — Telephone Encounter (Signed)
Sent to [facility] pharmacy

## 2018-04-09 ENCOUNTER — Other Ambulatory Visit: Payer: Self-pay | Admitting: Gastroenterology

## 2018-04-25 ENCOUNTER — Ambulatory Visit (INDEPENDENT_AMBULATORY_CARE_PROVIDER_SITE_OTHER): Payer: BC Managed Care – PPO | Admitting: *Deleted

## 2018-04-25 DIAGNOSIS — J4550 Severe persistent asthma, uncomplicated: Secondary | ICD-10-CM | POA: Diagnosis not present

## 2018-05-02 ENCOUNTER — Encounter: Payer: Self-pay | Admitting: Gastroenterology

## 2018-05-02 ENCOUNTER — Ambulatory Visit: Payer: BC Managed Care – PPO | Admitting: Gastroenterology

## 2018-05-02 VITALS — BP 132/78 | HR 70 | Temp 97.0°F | Ht 62.0 in | Wt 245.2 lb

## 2018-05-02 DIAGNOSIS — K529 Noninfective gastroenteritis and colitis, unspecified: Secondary | ICD-10-CM

## 2018-05-02 NOTE — Progress Notes (Signed)
Primary Care Physician:  [name], MD  Primary GI: Dr Oneida Alar   Chief Complaint  Patient presents with  . Diarrhea    f/u.     HPI:   Melanie Brown is a 67 y.o. female presenting today with a history of likely IBS-D, with colonoscopy/EGD completed Dec 2016. Needs surveillance colonoscopy in later 2019 with MAC. Historically had been on Viberzi but unable to take this due to post-cholecystectomy state. Xifaxan without any improvement in past. She did well with Bentyl.  Bentyl BID now. 4 times a day was too much. If doesn't take it at night, will have diarrhea in the morning. Had  GI illness over the weekend but improved now. Has lost weight purposefully since Jan 2019 doing weight watchers. No rectal bleeding. No concerning upper GI symptoms. Excited about weight loss.    Past Medical History:  Diagnosis Date  . Anxiety   . Asthma   . Fibromyalgia   . Headache(784.0)   . IBS (irritable bowel syndrome)     Past Surgical History:  Procedure Laterality Date  . ABDOMINAL HYSTERECTOMY    . CESAREAN SECTION    . CHOLECYSTECTOMY    . COLONOSCOPY N/A 12/17/2015   Dr. Oneida Alar: multiple colon polyps, tubular adenomas and hyperplastic, sigmoid colon and descending colon diverticulosis, benign colonic mucosa on path, hemorrhoids. Surveillance in 2019 with overtube and MAC   . ESOPHAGOGASTRODUODENOSCOPY N/A 12/17/2015   Dr. Oneida Alar: multiple small gastric polyps, mild non-erosive gastritis, small hiatal hernia, negative celiac sprue  . SAVORY DILATION N/A 12/17/2015   Procedure: SAVORY DILATION;  Surgeon: Danie Binder, MD;  [facility];  Service: Endoscopy;  Laterality: N/A;  . SINUS ENDO WITH FUSION  08/09/2012   Procedure: SINUS ENDO WITH FUSION;  Surgeon: Melida Quitter, MD;  Location: [place];  Service: ENT;  Laterality: Bilateral;  Bilateral Frontal Recess Exploration  . SPHENOIDECTOMY  08/09/2012   Procedure: SPHENOIDECTOMY;  Surgeon: Melida Quitter, MD;  Location: [place];   Service: ENT;  Laterality: Bilateral;  . TONSILLECTOMY      Current Outpatient Medications  Medication Sig Dispense Refill  . albuterol (PROVENTIL HFA;VENTOLIN HFA) 108 (90 Base) MCG/ACT inhaler Inhale into the lungs every 6 (six) hours as needed for wheezing or shortness of breath.    . cetirizine (ZYRTEC) 10 MG tablet Take 10 mg by mouth daily.    Marland Kitchen dicyclomine (BENTYL) 10 MG capsule Take 1 capsule (10 mg total) by mouth 4 (four) times daily -  before meals and at bedtime. (Patient taking differently: Take 10 mg by mouth. 2-3 times per day) 120 capsule 5  . FASENRA 30 MG/ML SOSY SECOND SHIP: INJECT ONE SYRINGE UNDER THE SKIN ON WEEKS 4 AND 8, AFTERINITIAL DOSE ON WEEK 0. 1 Syringe 11  . Fluticasone Propionate (XHANCE) 93 MCG/ACT EXHU Place 2 sprays into the nose daily. 32 mL 3  . fluticasone-salmeterol (ADVAIR HFA) 115-21 MCG/ACT inhaler Inhale 2 puffs into the lungs 2 (two) times daily. 12 g 3  . ipratropium-albuterol (DUONEB) 0.5-2.5 (3) MG/3ML SOLN Take 3 mLs by nebulization every 4 (four) hours as needed. 75 mL 3  . montelukast (SINGULAIR) 10 MG tablet Take 1 tablet (10 mg total) by mouth at bedtime. 30 tablet 5  . Multiple Vitamin (MULTIVITAMIN) capsule Take 1 capsule by mouth daily. Reported on 02/24/2016    . omeprazole (PRILOSEC) 40 MG capsule Take 1 capsule (40 mg total) by mouth daily. 90 capsule 3  . SPIRIVA RESPIMAT 1.25  MCG/ACT AERS INHALE 2 PUFFS INTO THE LUNGS DAILY. (Patient taking differently: INHALE 2 PUFFS INTO THE LUNGS DAILY AS NEEDED) 4 g 5  . SUMAtriptan (IMITREX) 100 MG tablet Take 100 mg by mouth daily as needed.  6  . esomeprazole (NEXIUM) 40 MG capsule TAKE ONE CAPSULE BY MOUTH EVERY DAY BEFORE BREAKFAST (Patient not taking: Reported on 05/02/2018) 90 capsule 3  . PRESCRIPTION MEDICATION xchance nasal spray    . Respiratory Therapy Supplies (NEBULIZER) DEVI Use As Directed 1 each 0   Current Facility-Administered Medications  Medication Dose Route Frequency Provider  Last Rate Last Dose  . Benralizumab SOSY 30 mg  30 mg Subcutaneous Q28 days Valentina Shaggy, MD   30 mg at 04/25/18 4010    Allergies as of 05/02/2018 - Review Complete 12/30/2017  Allergen Reaction Noted  . Aspirin Shortness Of Breath and Rash 05/23/2008  . Chloramphenicols Shortness Of Breath 12/28/2013  . Procaine hcl Other (See Comments) 05/23/2008  . Penicillins Rash 05/23/2008    Family History  Problem Relation Age of Onset  . Colon cancer Father 63       COLON RESECTED  . Colon polyps Neg Hx   . Allergic rhinitis Neg Hx   . Angioedema Neg Hx   . Asthma Neg Hx   . Atopy Neg Hx   . Eczema Neg Hx   . Immunodeficiency Neg Hx   . Urticaria Neg Hx     Social History   Socioeconomic History  . Marital status: Single    Spouse name: Not on file  . Number of children: 2  . Years of education: college  . Highest education level: Not on file  Occupational History  . Occupation: [employer]  . Financial resource strain: Not on file  . Food insecurity:    Worry: Not on file    Inability: Not on file  . Transportation needs:    Medical: Not on file    Non-medical: Not on file  Tobacco Use  . Smoking status: Never Smoker  . Smokeless tobacco: Never Used  Substance and Sexual Activity  . Alcohol use: No  . Drug use: No  . Sexual activity: Not on file  Lifestyle  . Physical activity:    Days per week: Not on file    Minutes per session: Not on file  . Stress: Not on file  Relationships  . Social connections:    Talks on phone: Not on file    Gets together: Not on file    Attends religious service: Not on file    Active member of club or organization: Not on file    Attends meetings of clubs or organizations: Not on file    Relationship status: Not on file  Other Topics Concern  . Not on file  Social History Narrative  . Not on file    Review of Systems: Gen: Denies fever, chills, anorexia.  Denies fatigue, weakness, weight loss.  CV: Denies chest pain, palpitations, syncope, peripheral edema, and claudication. Resp: Denies dyspnea at rest, cough, wheezing, coughing up blood, and pleurisy. GI: as mentioned in HPI  Derm: Denies rash, itching, dry skin Psych: Denies depression, anxiety, memory loss, confusion. No homicidal or suicidal ideation.  Heme: Denies bruising, bleeding, and enlarged lymph nodes.  Physical Exam: BP 132/78   Pulse 70   Temp (!) 97 F (36.1 C) (Oral)   Ht _0  (1.575 m)  Wt 245 lb 3.2 oz (111.2 kg)   BMI 44.85 kg/m  General:   Alert and oriented. No distress noted. Pleasant and cooperative.  Head:  Normocephalic and atraumatic. Eyes:  Conjuctiva clear without scleral icterus. Mouth:  Oral mucosa pink and moist. Good dentition. No lesions. Abdomen:  +BS, soft, non-tender and non-distended. No rebound or guarding. No HSM or masses noted. Msk:  Symmetrical without gross deformities. Normal posture. Extremities:  Without edema. Neurologic:  Alert and  oriented x4 Psych:  Alert and cooperative. Normal mood and affect.

## 2018-05-02 NOTE — Patient Instructions (Signed)
I will see you in November!  Continue Bentyl as you are doing.  It was good to see you today, and I am so proud of your hard work with weight loss! You are inspiring!  It was a pleasure to see you today. I strive to create trusting relationships with patients to provide genuine, compassionate, and quality care. I value your feedback. If you receive a survey regarding your visit,  I greatly appreciate you taking time to fill this out.   Annitta Needs, PhD, ANP-BC [facility]

## 2018-05-03 NOTE — Assessment & Plan Note (Signed)
67 year old female with history of chronic diarrhea, no evidence for microscopic colitis on prior colonoscopy. Due for colonoscopy later in 2019. Continue with Bentyl, return in November to arrange colonoscopy.

## 2018-05-04 NOTE — Progress Notes (Signed)
cc'ed to pcp °

## 2018-06-02 ENCOUNTER — Other Ambulatory Visit: Payer: Self-pay | Admitting: Allergy & Immunology

## 2018-06-20 ENCOUNTER — Ambulatory Visit (INDEPENDENT_AMBULATORY_CARE_PROVIDER_SITE_OTHER): Payer: BC Managed Care – PPO | Admitting: *Deleted

## 2018-06-20 DIAGNOSIS — J4550 Severe persistent asthma, uncomplicated: Secondary | ICD-10-CM | POA: Diagnosis not present

## 2018-06-20 DIAGNOSIS — J4540 Moderate persistent asthma, uncomplicated: Secondary | ICD-10-CM

## 2018-08-08 ENCOUNTER — Other Ambulatory Visit: Payer: Self-pay | Admitting: Allergy & Immunology

## 2018-08-15 ENCOUNTER — Ambulatory Visit (INDEPENDENT_AMBULATORY_CARE_PROVIDER_SITE_OTHER): Payer: BC Managed Care – PPO | Admitting: *Deleted

## 2018-08-15 DIAGNOSIS — J4540 Moderate persistent asthma, uncomplicated: Secondary | ICD-10-CM

## 2018-10-10 ENCOUNTER — Ambulatory Visit: Payer: BC Managed Care – PPO

## 2018-10-10 DIAGNOSIS — J4550 Severe persistent asthma, uncomplicated: Secondary | ICD-10-CM | POA: Diagnosis not present

## 2018-10-11 ENCOUNTER — Ambulatory Visit (INDEPENDENT_AMBULATORY_CARE_PROVIDER_SITE_OTHER): Payer: BC Managed Care – PPO

## 2018-10-11 DIAGNOSIS — J4550 Severe persistent asthma, uncomplicated: Secondary | ICD-10-CM

## 2018-10-26 ENCOUNTER — Other Ambulatory Visit: Payer: Self-pay

## 2018-10-26 MED ORDER — FLUTICASONE PROPIONATE 93 MCG/ACT NA EXHU: 2.0000 | INHALANT_SUSPENSION | Freq: Every day | NASAL | 0 refills | Status: DC

## 2018-10-26 NOTE — Telephone Encounter (Signed)
Prescription request for [facility]. Patient was due for follow up in March. I am sending in a courtesy refill with note to make appointment.

## 2018-10-31 ENCOUNTER — Other Ambulatory Visit: Payer: Self-pay | Admitting: Allergy & Immunology

## 2018-11-02 ENCOUNTER — Telehealth: Payer: Self-pay

## 2018-11-02 ENCOUNTER — Other Ambulatory Visit: Payer: Self-pay

## 2018-11-02 ENCOUNTER — Encounter: Payer: Self-pay | Admitting: Nurse Practitioner

## 2018-11-02 ENCOUNTER — Ambulatory Visit: Payer: BC Managed Care – PPO | Admitting: Nurse Practitioner

## 2018-11-02 VITALS — BP 150/84 | HR 73 | Temp 97.0°F | Ht 62.0 in | Wt 262.8 lb

## 2018-11-02 DIAGNOSIS — K219 Gastro-esophageal reflux disease without esophagitis: Secondary | ICD-10-CM | POA: Diagnosis not present

## 2018-11-02 DIAGNOSIS — Z860100 Personal history of colon polyps, unspecified: Secondary | ICD-10-CM | POA: Insufficient documentation

## 2018-11-02 DIAGNOSIS — K529 Noninfective gastroenteritis and colitis, unspecified: Secondary | ICD-10-CM

## 2018-11-02 MED ORDER — NA SULFATE-K SULFATE-MG SULF 17.5-3.13-1.6 GM/177ML PO SOLN: 1.0000 | ORAL | 0 refills | Status: DC

## 2018-11-02 NOTE — Progress Notes (Signed)
cc'ed to pcp °

## 2018-11-02 NOTE — Telephone Encounter (Signed)
Called and informed pt of pre-op appt 01/23/19 at 10:00am. Letter mailed.

## 2018-11-02 NOTE — Patient Instructions (Signed)
1. Continue your current medications. 2. We will schedule your colonoscopy for you. 3. Further recommendations will be made after your colonoscopy. 4. Return for follow-up in 6 months. 5. Call us if you have any questions or concerns.  At [facility] we value your feedback. You may receive a survey about your visit today. Please share your experience as we strive to create trusting relationships with our patients to provide genuine, compassionate, quality care.  We appreciate your understanding and patience as we review any laboratory studies, imaging, and other diagnostic tests that are ordered as we care for you. Our office policy is 5 business days for review of these results, and any emergent or urgent results are addressed in a timely manner for your best interest. If you do not hear from our office in 1 week, please contact us.   We also encourage the use of MyChart, which contains your medical information for your review as well. If you are not enrolled in this feature, an access code is on this after visit summary for your convenience. Thank you for allowing [place] to be involved in your care.  It was great to meet you today!  I hope you have a great Fall!!

## 2018-11-02 NOTE — Progress Notes (Addendum)
REVIEWED-NO ADDITIONAL RECOMMENDATIONS.  Referring Provider: [name], MD Primary Care Physician:  [name], MD Primary GI:  Dr. Oneida Alar  Chief Complaint  Patient presents with  . Diarrhea    f/u. Takes bentyl 3-4 times per day. Has loose stools on a daily basis if she does not take the bentyl    HPI:   Melanie Brown is a 67 y.o. female who presents for follow-up.  Patient was last seen in our office 05/02/2018 for chronic diarrhea.  Noted history of likely IBS-D with colonoscopy and endoscopy up-to-date 2016.  Needs surveillance colonoscopy later in 2019.  Previously on Viberzi but this is discontinued due to postcholecystectomy state after FDA warnings.  Xifaxan without improvement.  Did well on Bentyl.  At his last visit he was taking Bentyl twice a day and if he does not take it at night he will have diarrhea in the morning.  GI illness over the weekend but improved.  Purposeful weight loss since January 2019 doing weight watchers.  No other GI symptoms.  Recommended continue Bentyl, follow-up in November for scheduling of colonoscopy and follow-up.  Continue excellent weight loss efforts.  Today she states she's doing ok overall. Typically takes Bentyl twice a day (morning and evening) and if she's having a "worse day" she'll take a dose during the day. Having some increased diarrhea with dietary changes including more fiber. Intermittent mild abdominal pain which tends to occur with certain dietary triggers, brief, self-resolves. GERD well-controlled on prilosec, no breakthrough symptoms unless she eats specific dietary triggers. Denies N/V, hematochezia, melena, fever, chills, unintentional weight loss. Denies chest pain, dyspnea, dizziness, lightheadedness, syncope, near syncope. Denies any other upper or lower GI symptoms.  Past Medical History:  Diagnosis Date  . Anxiety   . Asthma   . Fibromyalgia   . Headache(784.0)   . IBS (irritable bowel syndrome)     Past Surgical  History:  Procedure Laterality Date  . ABDOMINAL HYSTERECTOMY    . CESAREAN SECTION    . CHOLECYSTECTOMY    . COLONOSCOPY N/A 12/17/2015   Dr. Oneida Alar: multiple colon polyps, tubular adenomas and hyperplastic, sigmoid colon and descending colon diverticulosis, benign colonic mucosa on path, hemorrhoids. Surveillance in 2019 with overtube and MAC   . ESOPHAGOGASTRODUODENOSCOPY N/A 12/17/2015   Dr. Oneida Alar: multiple small gastric polyps, mild non-erosive gastritis, small hiatal hernia, negative celiac sprue  . SAVORY DILATION N/A 12/17/2015   Procedure: SAVORY DILATION;  Surgeon: Danie Binder, MD;  [facility];  Service: Endoscopy;  Laterality: N/A;  . SINUS ENDO WITH FUSION  08/09/2012   Procedure: SINUS ENDO WITH FUSION;  Surgeon: Melida Quitter, MD;  Location: [place];  Service: ENT;  Laterality: Bilateral;  Bilateral Frontal Recess Exploration  . SPHENOIDECTOMY  08/09/2012   Procedure: SPHENOIDECTOMY;  Surgeon: Melida Quitter, MD;  Location: [place];  Service: ENT;  Laterality: Bilateral;  . TONSILLECTOMY      Current Outpatient Medications  Medication Sig Dispense Refill  . ADVAIR HFA 115-21 MCG/ACT inhaler TAKE 2 PUFFS BY MOUTH TWICE A DAY 12 Inhaler 0  . albuterol (PROVENTIL HFA;VENTOLIN HFA) 108 (90 Base) MCG/ACT inhaler Inhale into the lungs every 6 (six) hours as needed for wheezing or shortness of breath.    . cetirizine (ZYRTEC) 10 MG tablet Take 10 mg by mouth daily.    Marland Kitchen dicyclomine (BENTYL) 10 MG capsule Take 1 capsule (10 mg total) by mouth 4 (four) times daily -  before meals and at bedtime. (Patient  taking differently: Take 10 mg by mouth. 2-3 times per day) 120 capsule 5  . FASENRA 30 MG/ML SOSY THIRD SHIP: INJECT ONE SYRINGE UNDER THE SKIN ON WEEK 8, THEN EVERY 8WEEKS THEREAFTER. 1 Syringe 6  . Fluticasone Propionate (XHANCE) 93 MCG/ACT EXHU Place 2 sprays into the nose daily. 32 mL 0  . ipratropium-albuterol (DUONEB) 0.5-2.5 (3) MG/3ML SOLN Take 3 mLs by nebulization  every 4 (four) hours as needed. 75 mL 3  . Multiple Vitamin (MULTIVITAMIN) capsule Take 1 capsule by mouth daily. Reported on 02/24/2016    . omeprazole (PRILOSEC) 40 MG capsule Take 1 capsule (40 mg total) by mouth daily. 90 capsule 3  . Respiratory Therapy Supplies (NEBULIZER) DEVI Use As Directed 1 each 0  . SPIRIVA RESPIMAT 1.25 MCG/ACT AERS INHALE 2 PUFFS INTO THE LUNGS DAILY. (Patient taking differently: INHALE 2 PUFFS INTO THE LUNGS DAILY AS NEEDED) 4 g 5  . SUMAtriptan (IMITREX) 100 MG tablet Take 100 mg by mouth daily as needed.  6   Current Facility-Administered Medications  Medication Dose Route Frequency Provider Last Rate Last Dose  . Benralizumab SOSY 30 mg  30 mg Subcutaneous Q28 days Valentina Shaggy, MD   30 mg at 10/11/18 9038    Allergies as of 11/02/2018 - Review Complete 11/02/2018  Allergen Reaction Noted  . Aspirin Shortness Of Breath and Rash 05/23/2008  . Chloramphenicols Shortness Of Breath 12/28/2013  . Procaine hcl Other (See Comments) 05/23/2008  . Penicillins Rash 05/23/2008    Family History  Problem Relation Age of Onset  . Colon cancer Father 53       COLON RESECTED  . Colon polyps Neg Hx   . Allergic rhinitis Neg Hx   . Angioedema Neg Hx   . Asthma Neg Hx   . Atopy Neg Hx   . Eczema Neg Hx   . Immunodeficiency Neg Hx   . Urticaria Neg Hx     Social History   Socioeconomic History  . Marital status: Single    Spouse name: Not on file  . Number of children: 2  . Years of education: college  . Highest education level: Not on file  Occupational History  . Occupation: [place]: [employer]  . Financial resource strain: Not on file  . Food insecurity:    Worry: Not on file    Inability: Not on file  . Transportation needs:    Medical: Not on file    Non-medical: Not on file  Tobacco Use  . Smoking status: Never Smoker  . Smokeless tobacco: Never Used  Substance and Sexual Activity    . Alcohol use: No  . Drug use: No  . Sexual activity: Not on file  Lifestyle  . Physical activity:    Days per week: Not on file    Minutes per session: Not on file  . Stress: Not on file  Relationships  . Social connections:    Talks on phone: Not on file    Gets together: Not on file    Attends religious service: Not on file    Active member of club or organization: Not on file    Attends meetings of clubs or organizations: Not on file    Relationship status: Not on file  Other Topics Concern  . Not on file  Social History Narrative  . Not on file    Review of Systems: Complete ROS negative except as per HPI.  Physical Exam: BP (!) 150/84   Pulse 73   Temp (!) 97 F (36.1 C) (Oral)   Ht 5' 2"  (1.575 m)   Wt 262 lb 12.8 oz (119.2 kg)   BMI 48.07 kg/m  General:   Alert and oriented. Pleasant and cooperative. Well-nourished and well-developed.  Eyes:  Without icterus, sclera clear and conjunctiva pink.  Ears:  Normal auditory acuity. Cardiovascular:  S1, S2 present without murmurs appreciated. Extremities without clubbing or edema. Respiratory:  Clear to auscultation bilaterally. No wheezes, rales, or rhonchi. No distress.  Gastrointestinal:  +BS, soft, non-tender and non-distended. No HSM noted. No guarding or rebound. No masses appreciated.  Rectal:  Deferred  Musculoskalatal:  Symmetrical without gross deformities. Neurologic:  Alert and oriented x4;  grossly normal neurologically. Psych:  Alert and cooperative. Normal mood and affect. Heme/Lymph/Immune: No excessive bruising noted.    11/02/2018 10:21 AM   Disclaimer: This note was dictated with voice recognition software. Similar sounding words can inadvertently be transcribed and may not be corrected upon review.

## 2018-11-03 ENCOUNTER — Telehealth: Payer: Self-pay

## 2018-11-03 NOTE — Telephone Encounter (Signed)
Pt currently scheduled for TCS w/Propofol w/SLF 01/30/19. Procedure will need to be rescheduled d/t SLF will be off that day. Tried to call pt to reschedule, no answer, LMOVM.

## 2018-11-06 NOTE — Telephone Encounter (Signed)
Called pt, TCS w/Propofol w/SLF rescheduled to 02/06/19 at 11:00am. Called and informed endo scheduler. Pre-op rescheduled to 01/30/19 at 10:00am. Called pt back and informed her of new pre-op appt. Letter mailed with procedure instructions.

## 2018-11-10 NOTE — Assessment & Plan Note (Signed)
GERD currently well managed on Prilosec.  Continue current medications, follow-up as needed.

## 2018-11-10 NOTE — Assessment & Plan Note (Signed)
Chronic diarrhea which she attributes increased to dietary changes including more fiber.  Bentyl twice a day if she is having a "worst day" which generally works well for her.  Recommend continue current regimen, follow-up in 6 months.

## 2018-11-10 NOTE — Assessment & Plan Note (Signed)
Previous colonoscopy completed 2016 with recommended repeat in 3 years (2019) due to multiple polyps/tubular adenoma.  She is currently due and we will schedule at this time.  Proceed with colonoscopy on propofol/MAC with Dr. Oneida Alar in the near future. The risks, benefits, and alternatives have been discussed in detail with the patient. They state understanding and desire to proceed.   The patient is not on any anticoagulants, anxiolytics, chronic pain medications, or antidepressants.  After last procedure was recommended her future procedures to be completed on propofol/MAC and we will do so to promote adequate sedation.

## 2018-12-06 ENCOUNTER — Ambulatory Visit: Payer: Self-pay

## 2018-12-29 ENCOUNTER — Encounter: Payer: Self-pay | Admitting: Allergy & Immunology

## 2018-12-29 ENCOUNTER — Ambulatory Visit: Payer: Medicare Other | Admitting: Allergy & Immunology

## 2018-12-29 VITALS — BP 136/72 | HR 74 | Resp 18 | Ht 62.5 in | Wt 268.0 lb

## 2018-12-29 DIAGNOSIS — Z886 Allergy status to analgesic agent status: Secondary | ICD-10-CM

## 2018-12-29 DIAGNOSIS — J3089 Other allergic rhinitis: Secondary | ICD-10-CM

## 2018-12-29 DIAGNOSIS — Z8709 Personal history of other diseases of the respiratory system: Secondary | ICD-10-CM

## 2018-12-29 DIAGNOSIS — J4550 Severe persistent asthma, uncomplicated: Secondary | ICD-10-CM

## 2018-12-29 DIAGNOSIS — J45909 Unspecified asthma, uncomplicated: Secondary | ICD-10-CM | POA: Diagnosis not present

## 2018-12-29 DIAGNOSIS — J339 Nasal polyp, unspecified: Secondary | ICD-10-CM

## 2018-12-29 NOTE — Progress Notes (Signed)
FOLLOW UP  Date of Service/Encounter:  12/29/18   Assessment:   Severe persistent asthma without complication  History of nasal polyposis  Perennial allergic rhinitis (molds)  Aspirin intolerance  Samter's triad   Asthma Reportables:  Severity: severe persistent  Risk: high Control: not well controlled   Ms. Myung presents for a follow up visit.  She has a history of aspirin exacerbated respiratory disease and has remained very stable on the benralizumab every 8 weeks.  Unfortunately, her transition to Medicare has resulted in her being charged a co-pay of $200 for each injection.  On her fixed income, she tells me that she is unable to afford this.  We are going to talk to [place], our Biologics Coordinator, to try to get her on a co-pay assistance program.  In the interim, we are going to continue with all of her asthma medications including Advair 230/21 mcg 2 puffs twice daily.  She does have Spiriva and Qvar which she adds during respiratory flares.  She has weaned herself off of Singulair completely.  She remains on the cetirizine 10 mg daily.  She also is using Truett Perna which has been controlling her nasal polyps very well.  We are going to continue this for now.  It is unclear whether or how much her medications will be covered with her [employer] and [employer].   Plan/Recommendations:   1. Chronic allergic rhinitis - Continue with cetirizine 10mg  daily. - We will work on getting the [place] covered.  - Sample provided today.   2. Severe persistent asthma, uncomplicated with coexisting aspirin exacerbated respiratory disease (Samter's Triad) - Lung function looked stable today.  - [name] will reach out to discuss the copay assistance program with you.  - We will not make any changes today, but we may consider changing if the Advair is too expensive.  - Give [place] a call with an update.  - Daily controller medication(s): Advair 230/21 two puffs twice daily  WITH SPACER + Fasenra every 8 weeks (if we can get it approved) - Rescue medications: ProAir 4 puffs every 4-6 hours as needed or albuterol nebulizer one vial puffs every 4-6 hours as needed - Changes during respiratory infections or worsening symptoms: add Qvar 4 puffs midday for TWO WEEKS WITH SPACER + Spiriva two puffs once daily - Asthma control goals:  * Full participation in all desired activities (may need albuterol before activity) * Albuterol use two time or less a week on average (not counting use with activity) * Cough interfering with sleep two time or less a month * Oral steroids no more than once a year * No hospitalizations - Here is my email address in case you need to contact me in the future (it goes directly to me): [email or web address]  3. Return in about 6 months (around 06/29/2019).  Subjective:   ZENIA GUEST is a 68 y.o. female presenting today for follow up of  Chief Complaint  Patient presents with  . Asthma    AYARI LIWANAG has a history of the following: Patient Active Problem List   Diagnosis Date Noted  . Personal history of colonic polyps 11/02/2018  . Need for hepatitis C screening test 12/30/2017  . Chronic diarrhea 11/15/2017  . Severe persistent asthma without complication 67/67/2094  . Samter's triad 11/09/2016  . Chronic nonseasonal allergic rhinitis due to fungal spores 11/09/2016  . Aspirin intolerance 11/09/2016  . History of nasal polyposis 11/09/2016  . Dysphagia   . Change in  bowel habits 11/20/2015  . Rectal bleeding 11/20/2015  . GERD (gastroesophageal reflux disease) 11/20/2015  . Loose stools 11/20/2015  . Cervical radiculopathy 12/28/2013  . Headache, migraine 12/03/2013    History obtained from: chart review and patient.  [facility] Primary Care Provider is [name], MD.     Jamacia is a 68 y.o. female presenting for a follow up visit. She was last seen in an office visit in January 2019.  At that  time, her rhinitis was under fairly good control, although with her history of nasal polyposis she had chronic nasal congestion.  We continued her on Singulair 10 mg daily and cetirizine 10 mg daily.  We also started her on Xhance 2 sprays per nostril 1-2 times daily.  For her aspirin exacerbated respiratory disease, her lung function looked worse, but it did improve with a nebulizer treatment.  We increased her Advair to the 230/21 mcg dose.  Over the last 18 prior months, she had a consistent decline in her pulmonary function.  She had been on mepolizumab for a period of several months, without improvement.  Therefore, at that visit 1 year ago, we changed her to benralizumab.  We also continued Spiriva 1.25 mcg 2 puffs once daily.  She did have Qvar to add during respiratory flares.   Since the last visit, she has mostly done well.  She did start the benralizumab approximately 1 year ago and is now spaced out to every 8 weeks.  This is been a remarkable change for her.  Her lung function is now normal and she has not needed prednisone in over 1 year.  Prior to this, she was requiring prednisone around 2-3 times per year, and this frequency changed only slightly with the addition of the mepolizumab.  With the benralizumab, she has had remarkable improvement.  She also feels that the new nasal spray has helped tremendously.    Unfortunately, in the interim, she did transition to Medicare in December 2019.  This did mess with her coverage and she found out the Berna Bue was going to be a $200 co-pay each time.  She was on the [employer] state plan before Medicare kicked in, but then per recommendations from the state health plan, she changed to [employer].  She was told that there would be no donut hole, but she did not realize that her co-pays for medications may change.    She was due for a Fasenra injection in mid December, but did not do it due to the cost.  She is looking for  alternatives at this point.  She is currently in a transitional point in her life as well.  She did sell her house 2 years ago and is now renting a smaller house.  She is going to be moving in February to live with her daughter and her family in [place], [place].  She is planning to keep all of her physicians here since they all know her so well, however.  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   Asthma/Respiratory Symptom History: Her last injection of Berna Bue was October and she was due December 18th. She has not felt any worse since it was due. She did have a sinus infection that resolved without antibiotics. She remains on Advair 230/21 two puffs BID. She has used a spacer for the most part. She has not needed  the Qvar for one year. She does use Spiriva when it gets really bad. She does avoid triggering scents.   Allergic Rhinitis Symptom History: She does like the Plato. She is using one spray per nostril BID. Since the winter, she has been doing once daily to make it last longer. She feels that this makes her breathe easier and breaks up mucous in her nose so that she can breathe.   Otherwise, there have been no changes to her past medical history, surgical history, family history, or social history.  Of note, she is in a transitional time in her life.  She is working on moving in with her daughter who lives about 4 hours east of here.  However, she is going to keep all of her doctors here in [place].    Review of Systems: a 14-point review of systems is pertinent for what is mentioned in HPI.  Otherwise, all other systems were negative.  Constitutional: negative other than that listed in the HPI Eyes: negative other than that listed in the HPI Ears, nose, mouth, throat, and face: negative other than that listed in the HPI Respiratory: negative other than that listed in the HPI Cardiovascular: negative other than that listed  in the HPI Gastrointestinal: negative other than that listed in the HPI Genitourinary: negative other than that listed in the HPI Integument: negative other than that listed in the HPI Hematologic: negative other than that listed in the HPI Musculoskeletal: negative other than that listed in the HPI Neurological: negative other than that listed in the HPI Allergy/Immunologic: negative other than that listed in the HPI    Objective:   Blood pressure 136/72, pulse 74, resp. rate 18, height 5' 2.5" (1.588 m), weight 268 lb (121.6 kg), SpO2 98 %. Body mass index is 48.24 kg/m.   Physical Exam:  General: Alert, interactive, in no acute distress. Obese female.  Eyes: No conjunctival injection bilaterally, no discharge on the right, no discharge on the left, no Horner-Trantas dots present and allergic shiners present bilaterally. PERRL bilaterally. EOMI without pain. No photophobia.  Ears: Right TM pearly gray with normal light reflex, Left TM pearly gray with normal light reflex, Right TM intact without perforation and Left TM intact without perforation.  Nose/Throat: External nose within normal limits and septum midline. Turbinates edematous and pale with clear discharge. Posterior oropharynx erythematous with cobblestoning in the posterior oropharynx. Tonsils 2+ without exudates.  Tongue without thrush. Lungs: Clear to auscultation without wheezing, rhonchi or rales. No increased work of breathing. CV: Normal S1/S2. No murmurs. Capillary refill <2 seconds.  Skin: Warm and dry, without lesions or rashes. Neuro:   Grossly intact. No focal deficits appreciated. Responsive to questions.  Diagnostic studies:   Spirometry: results normal (FEV1: 1.70/83%, FVC: 1.85/65%, FEV1/FVC: 91%).    Spirometry consistent with normal pattern.    Allergy Studies: none      Salvatore Marvel, MD  [facility]

## 2018-12-29 NOTE — Patient Instructions (Addendum)
1. Chronic allergic rhinitis - Continue with cetirizine 10mg  daily. - We will work on getting the [place] covered.  - Sample provided today.   2. Severe persistent asthma, uncomplicated with coexisting aspirin exacerbated respiratory disease (Samter's Triad) - Lung function looked stable today.  - [name] will reach out to discuss the copay assistance program with you.  - We will not make any changes today, but we may consider changing if the Advair is too expensive.  - Give [place] a call with an update.  - Daily controller medication(s): Advair 230/21 two puffs twice daily WITH SPACER + Fasenra every 8 weeks (if we can get it approved) - Rescue medications: ProAir 4 puffs every 4-6 hours as needed or albuterol nebulizer one vial puffs every 4-6 hours as needed - Changes during respiratory infections or worsening symptoms: add Qvar 4 puffs midday for TWO WEEKS WITH SPACER + Spiriva two puffs once daily - Asthma control goals:  * Full participation in all desired activities (may need albuterol before activity) * Albuterol use two time or less a week on average (not counting use with activity) * Cough interfering with sleep two time or less a month * Oral steroids no more than once a year * No hospitalizations - Here is my email address in case you need to contact me in the future (it goes directly to me): [email or web address]  3. Return in about 6 months (around 06/29/2019).   Please inform us of any Emergency Department visits, hospitalizations, or changes in symptoms. Call us before going to the ED for breathing or allergy symptoms since we might be able to fit you in for a sick visit. Feel free to contact us anytime with any questions, problems, or concerns.  It was a pleasure to see you again today! Happy New Year! Good luck with the move!  Websites that have reliable patient information: 1. American Academy of Asthma, Allergy, and Immunology: www.aaaai.org 2. Food Allergy Research  and Education (FARE): foodallergy.org 3. Mothers of Asthmatics: http://www.asthmacommunitynetwork.org 4. American College of Allergy, Asthma, and Immunology: www.acaai.org

## 2019-01-18 MED ORDER — FLUTICASONE-SALMETEROL 115-21 MCG/ACT IN AERO: INHALATION_SPRAY | RESPIRATORY_TRACT | 4 refills | Status: DC

## 2019-01-18 NOTE — Addendum Note (Signed)
Addended by: Valentina Shaggy on: 01/18/2019 09:15 AM   Modules accepted: Orders

## 2019-01-23 ENCOUNTER — Other Ambulatory Visit (HOSPITAL_COMMUNITY): Payer: BC Managed Care – PPO

## 2019-01-24 ENCOUNTER — Telehealth: Payer: Self-pay

## 2019-01-24 NOTE — Telephone Encounter (Signed)
Patient is calling to see when she received her pneumonia shot or blood work so she can update her PCP.  Please Advise

## 2019-01-24 NOTE — Telephone Encounter (Signed)
Left message advising patient that date of most recent Pneumonia vaccine (Pneumo 23) was 10-08-2018.

## 2019-01-29 ENCOUNTER — Encounter (HOSPITAL_COMMUNITY): Payer: Self-pay

## 2019-01-29 ENCOUNTER — Telehealth: Payer: Self-pay | Admitting: Gastroenterology

## 2019-01-29 NOTE — Telephone Encounter (Signed)
Pt has questions about how her colonoscopy is going to be coded. Her procedure isn't until 2/18 with [facility], but she said someone from [facility] had called her about out of pocket co payment and recommended her to call us. [phone]

## 2019-01-29 NOTE — Telephone Encounter (Signed)
Called patient and made aware it will be coded as diagnostic as she has a  hx polyps. She voiced understanding.

## 2019-01-30 ENCOUNTER — Encounter (HOSPITAL_COMMUNITY)
Admission: RE | Admit: 2019-01-30 | Discharge: 2019-01-30 | Disposition: A | Discharge status: Home / Self Care | Payer: Medicare Other | Source: Ambulatory Visit | Attending: Gastroenterology | Admitting: Gastroenterology

## 2019-02-04 ENCOUNTER — Other Ambulatory Visit: Payer: Self-pay | Admitting: Gastroenterology

## 2019-02-06 ENCOUNTER — Other Ambulatory Visit: Payer: Self-pay

## 2019-02-06 ENCOUNTER — Ambulatory Visit (HOSPITAL_COMMUNITY): Payer: Medicare Other | Admitting: Anesthesiology

## 2019-02-06 ENCOUNTER — Encounter (HOSPITAL_COMMUNITY)
Admission: RE | Disposition: A | Discharge status: Home / Self Care | Payer: Self-pay | Source: Home / Self Care | Attending: Gastroenterology

## 2019-02-06 ENCOUNTER — Ambulatory Visit (HOSPITAL_COMMUNITY)
Admission: RE | Admit: 2019-02-06 | Discharge: 2019-02-06 | Disposition: A | Discharge status: Home / Self Care | Payer: Medicare Other | Attending: Gastroenterology | Admitting: Gastroenterology

## 2019-02-06 ENCOUNTER — Encounter (HOSPITAL_COMMUNITY): Payer: Self-pay | Admitting: *Deleted

## 2019-02-06 DIAGNOSIS — D120 Benign neoplasm of cecum: Secondary | ICD-10-CM | POA: Diagnosis not present

## 2019-02-06 DIAGNOSIS — D122 Benign neoplasm of ascending colon: Secondary | ICD-10-CM | POA: Insufficient documentation

## 2019-02-06 DIAGNOSIS — Z09 Encounter for follow-up examination after completed treatment for conditions other than malignant neoplasm: Secondary | ICD-10-CM | POA: Diagnosis present

## 2019-02-06 DIAGNOSIS — J45909 Unspecified asthma, uncomplicated: Secondary | ICD-10-CM | POA: Diagnosis not present

## 2019-02-06 DIAGNOSIS — D123 Benign neoplasm of transverse colon: Secondary | ICD-10-CM | POA: Diagnosis not present

## 2019-02-06 DIAGNOSIS — K219 Gastro-esophageal reflux disease without esophagitis: Secondary | ICD-10-CM | POA: Insufficient documentation

## 2019-02-06 DIAGNOSIS — Z86010 Personal history of colonic polyps: Secondary | ICD-10-CM | POA: Insufficient documentation

## 2019-02-06 DIAGNOSIS — K644 Residual hemorrhoidal skin tags: Secondary | ICD-10-CM | POA: Diagnosis not present

## 2019-02-06 DIAGNOSIS — Z7951 Long term (current) use of inhaled steroids: Secondary | ICD-10-CM | POA: Insufficient documentation

## 2019-02-06 DIAGNOSIS — K589 Irritable bowel syndrome without diarrhea: Secondary | ICD-10-CM | POA: Insufficient documentation

## 2019-02-06 DIAGNOSIS — K5730 Diverticulosis of large intestine without perforation or abscess without bleeding: Secondary | ICD-10-CM | POA: Diagnosis not present

## 2019-02-06 DIAGNOSIS — K648 Other hemorrhoids: Secondary | ICD-10-CM | POA: Diagnosis not present

## 2019-02-06 HISTORY — PX: BIOPSY: SHX5522

## 2019-02-06 HISTORY — DX: Morbid (severe) obesity due to excess calories: E66.01

## 2019-02-06 HISTORY — DX: Gastro-esophageal reflux disease without esophagitis: K21.9

## 2019-02-06 HISTORY — PX: POLYPECTOMY: SHX5525

## 2019-02-06 HISTORY — PX: COLONOSCOPY WITH PROPOFOL: SHX5780

## 2019-02-06 HISTORY — DX: Dyspnea, unspecified: R06.00

## 2019-02-06 SURGERY — COLONOSCOPY WITH PROPOFOL
Anesthesia: General

## 2019-02-06 MED ORDER — HYDROMORPHONE HCL 1 MG/ML IJ SOLN: 0.2500 mg | INTRAMUSCULAR | Status: DC | PRN

## 2019-02-06 MED ORDER — PROMETHAZINE HCL 25 MG/ML IJ SOLN: 6.2500 mg | INTRAMUSCULAR | Status: DC | PRN

## 2019-02-06 MED ORDER — PROPOFOL 10 MG/ML IV BOLUS
INTRAVENOUS | Status: DC | PRN
  Administered 2019-02-06 (×2): 10 mg via INTRAVENOUS
  Administered 2019-02-06: 20 mg via INTRAVENOUS
  Administered 2019-02-06: 10 mg via INTRAVENOUS
  Administered 2019-02-06: 20 mg via INTRAVENOUS

## 2019-02-06 MED ORDER — CHLORHEXIDINE GLUCONATE CLOTH 2 % EX PADS: 6.0000 | MEDICATED_PAD | Freq: Once | CUTANEOUS | Status: DC

## 2019-02-06 MED ORDER — GLYCOPYRROLATE PF 0.2 MG/ML IJ SOSY
PREFILLED_SYRINGE | INTRAMUSCULAR | Status: DC | PRN
  Administered 2019-02-06: .2 mg via INTRAVENOUS

## 2019-02-06 MED ORDER — LACTATED RINGERS IV SOLN
INTRAVENOUS | Status: DC
  Administered 2019-02-06: 1000 mL via INTRAVENOUS

## 2019-02-06 MED ORDER — HYDROCODONE-ACETAMINOPHEN 7.5-325 MG PO TABS: 1.0000 | ORAL_TABLET | Freq: Once | ORAL | Status: DC | PRN

## 2019-02-06 MED ORDER — LIDOCAINE HCL (CARDIAC) PF 100 MG/5ML IV SOSY
PREFILLED_SYRINGE | INTRAVENOUS | Status: DC | PRN
  Administered 2019-02-06: 25 mg via INTRAVENOUS

## 2019-02-06 MED ORDER — PROPOFOL 500 MG/50ML IV EMUL
INTRAVENOUS | Status: DC | PRN
  Administered 2019-02-06: 150 ug/kg/min via INTRAVENOUS
  Administered 2019-02-06: 12:00:00 via INTRAVENOUS

## 2019-02-06 MED ORDER — MIDAZOLAM HCL 2 MG/2ML IJ SOLN: 0.5000 mg | Freq: Once | INTRAMUSCULAR | Status: DC | PRN

## 2019-02-06 NOTE — Transfer of Care (Signed)
Immediate Anesthesia Transfer of Care Note  Patient: Melanie Brown  Procedure(s) Performed: COLONOSCOPY WITH PROPOFOL (N/A ) BIOPSY POLYPECTOMY  Patient Location: PACU  Anesthesia Type:MAC  Level of Consciousness: awake  Airway & Oxygen Therapy: Patient Spontanous Breathing  Post-op Assessment: Report given to RN  Post vital signs: Reviewed and stable  Last Vitals:  Vitals Value Taken Time  BP    Temp    Pulse    Resp 20 02/06/2019 12:30 PM  SpO2    Vitals shown include unvalidated device data.  Last Pain:  Vitals:   02/06/19 1154  PainSc: 0-No pain      Patients Stated Pain Goal: 6 (02/54/27 0623)  Complications: No apparent anesthesia complications

## 2019-02-06 NOTE — Anesthesia Preprocedure Evaluation (Signed)
Anesthesia Evaluation  Patient identified by MRN, date of birth, ID band Patient awake    Reviewed: Allergy & Precautions, NPO status , Patient's Chart, lab work & pertinent test results  Airway Mallampati: II  TM Distance: >3 FB Neck ROM: Full    Dental no notable dental hx. (+) Teeth Intact   Pulmonary shortness of breath and with exertion, asthma ,    Pulmonary exam normal breath sounds clear to auscultation       Cardiovascular Exercise Tolerance: Good negative cardio ROS Normal cardiovascular examI Rhythm:Regular Rate:Normal     Neuro/Psych  Headaches, Anxiety  Neuromuscular disease negative psych ROS   GI/Hepatic Neg liver ROS, GERD  Medicated and Controlled,  Endo/Other  negative endocrine ROS  Renal/GU negative Renal ROS  negative genitourinary   Musculoskeletal  (+) Fibromyalgia -  Abdominal   Peds negative pediatric ROS (+)  Hematology negative hematology ROS (+)   Anesthesia Other Findings   Reproductive/Obstetrics negative OB ROS                             Anesthesia Physical Anesthesia Plan  ASA: II  Anesthesia Plan: General   Post-op Pain Management:    Induction: Intravenous  PONV Risk Score and Plan:   Airway Management Planned: Nasal Cannula and Simple Face Mask  Additional Equipment:   Intra-op Plan:   Post-operative Plan:   Informed Consent: I have reviewed the patients History and Physical, chart, labs and discussed the procedure including the risks, benefits and alternatives for the proposed anesthesia with the patient or authorized representative who has indicated his/her understanding and acceptance.     Dental advisory given  Plan Discussed with: CRNA  Anesthesia Plan Comments:         Anesthesia Quick Evaluation

## 2019-02-06 NOTE — Discharge Instructions (Signed)
You had 5 small polyps removed. You have internal hemorrhoids.   DRINK WATER TO KEEP YOUR URINE LIGHT YELLOW.  CONTINUE YOUR WEIGHT LOSS EFFORTS. YOUR BODY MASS INDEX IS OVER 40 WHICH MEANS YOU ARE MORBIDLY OBESE. OBESITY IS ASSOCIATED WITH AN INCREASED FOR CIRRHOSIS AND ALL CANCERS, INCLUDING ESOPHAGEAL AND COLON CANCER. A WEIGHT OF 215 LBS OR LESS WILL GET YOUR BODY MASS INDEX(BMI) UNDER 40 BUT YOUR BODY MASS INDEX WILL STILL BE OVER 30 WHICH MEANS YOU ARE OBESE.  A WEIGHT OF160 LBS OR LESS  WILL GET YOUR BODY MASS INDEX(BMI) UNDER 30.  If you are unable to lose weight on your own after 6 mos to a year, please call for a referral to the bariatric weight loss clinic.  FOLLOW A HIGH FIBER DIET. AVOID ITEMS THAT CAUSE BLOATING & GAS. SEE INFO BELOW.   YOUR BIOPSY RESULTS WILL BE BACK IN 5 BUSINESS DAYS.  Next colonoscopy in 3 years if more than 3 simple adenomas are removed.   Colonoscopy Care After Read the instructions outlined below and refer to this sheet in the next week. These discharge instructions provide you with general information on caring for yourself after you leave the hospital. While your treatment has been planned according to the most current medical practices available, unavoidable complications occasionally occur. If you have any problems or questions after discharge, call DR. Tangi Shroff, [phone].  ACTIVITY  You may resume your regular activity, but move at a slower pace for the next 24 hours.   Take frequent rest periods for the next 24 hours.   Walking will help get rid of the air and reduce the bloated feeling in your belly (abdomen).   No driving for 24 hours (because of the medicine (anesthesia) used during the test).   You may shower.   Do not sign any important legal documents or operate any machinery for 24 hours (because of the anesthesia used during the test).    NUTRITION  Drink plenty of fluids.   You may resume your normal diet as instructed by  your doctor.   Begin with a light meal and progress to your normal diet. Heavy or fried foods are harder to digest and may make you feel sick to your stomach (nauseated).   Avoid alcoholic beverages for 24 hours or as instructed.    MEDICATIONS  You may resume your normal medications.   WHAT YOU CAN EXPECT TODAY  Some feelings of bloating in the abdomen.   Passage of more gas than usual.   Spotting of blood in your stool or on the toilet paper  .  IF YOU HAD POLYPS REMOVED DURING THE COLONOSCOPY:  Eat a soft diet IF YOU HAVE NAUSEA, BLOATING, ABDOMINAL PAIN, OR VOMITING.    FINDING OUT THE RESULTS OF YOUR TEST Not all test results are available during your visit. DR. Oneida Alar WILL CALL YOU WITHIN 14 DAYS OF YOUR PROCEDUE WITH YOUR RESULTS. Do not assume everything is normal if you have not heard from DR. Nasira Janusz, CALL HER OFFICE AT [phone].  SEEK IMMEDIATE MEDICAL ATTENTION AND CALL THE OFFICE: [phone] IF:  You have more than a spotting of blood in your stool.   Your belly is swollen (abdominal distention).   You are nauseated or vomiting.   You have a temperature over 101F.   You have abdominal pain or discomfort that is severe or gets worse throughout the day.   High-Fiber Diet A high-fiber diet changes your normal diet to include more whole  grains, legumes, fruits, and vegetables. Changes in the diet involve replacing refined carbohydrates with unrefined foods. The calorie level of the diet is essentially unchanged. The Dietary Reference Intake (recommended amount) for adult males is 38 grams per day. For adult females, it is 25 grams per day. Pregnant and lactating women should consume 28 grams of fiber per day. Fiber is the intact part of a plant that is not broken down during digestion. Functional fiber is fiber that has been isolated from the plant to provide a beneficial effect in the body.  PURPOSE  Increase stool bulk.   Ease and regulate bowel  movements.   Lower cholesterol.   REDUCE RISK OF COLON CANCER  INDICATIONS THAT YOU NEED MORE FIBER  Constipation and hemorrhoids.   Uncomplicated diverticulosis (intestine condition) and irritable bowel syndrome.   Weight management.   As a protective measure against hardening of the arteries (atherosclerosis), diabetes, and cancer.   GUIDELINES FOR INCREASING FIBER IN THE DIET  Start adding fiber to the diet slowly. A gradual increase of about 5 more grams (2 slices of whole-wheat bread, 2 servings of most fruits or vegetables, or 1 bowl of high-fiber cereal) per day is best. Too rapid an increase in fiber may result in constipation, flatulence, and bloating.   Drink enough water and fluids to keep your urine clear or pale yellow. Water, juice, or caffeine-free drinks are recommended. Not drinking enough fluid may cause constipation.   Eat a variety of high-fiber foods rather than one type of fiber.   Try to increase your intake of fiber through using high-fiber foods rather than fiber pills or supplements that contain small amounts of fiber.   The goal is to change the types of food eaten. Do not supplement your present diet with high-fiber foods, but replace foods in your present diet.   INCLUDE A VARIETY OF FIBER SOURCES  Replace refined and processed grains with whole grains, canned fruits with fresh fruits, and incorporate other fiber sources. White rice, white breads, and most bakery goods contain little or no fiber.   Brown whole-grain rice, buckwheat oats, and many fruits and vegetables are all good sources of fiber. These include: broccoli, Brussels sprouts, cabbage, cauliflower, beets, sweet potatoes, white potatoes (skin on), carrots, tomatoes, eggplant, squash, berries, fresh fruits, and dried fruits.   Cereals appear to be the richest source of fiber. Cereal fiber is found in whole grains and bran. Bran is the fiber-rich outer coat of cereal grain, which is largely  removed in refining. In whole-grain cereals, the bran remains. In breakfast cereals, the largest amount of fiber is found in those with "bran" in their names. The fiber content is sometimes indicated on the label.   You may need to include additional fruits and vegetables each day.   In baking, for 1 cup white flour, you may use the following substitutions:   1 cup whole-wheat flour minus 2 tablespoons.   1/2 cup white flour plus 1/2 cup whole-wheat flour.   Polyps, Colon  A polyp is extra tissue that grows inside your body. Colon polyps grow in the large intestine. The large intestine, also called the colon, is part of your digestive system. It is a long, hollow tube at the end of your digestive tract where your body makes and stores stool. Most polyps are not dangerous. They are benign. This means they are not cancerous. But over time, some types of polyps can turn into cancer. Polyps that are smaller than a pea  are usually not harmful. But larger polyps could someday become or may already be cancerous. To be safe, doctors remove all polyps and test them.   WHO GETS POLYPS? Anyone can get polyps, but certain people are more likely than others. You may have a greater chance of getting polyps if:  You are over 50.   You have had polyps before.   Someone in your family has had polyps.   Someone in your family has had cancer of the large intestine.   Find out if someone in your family has had polyps. You may also be more likely to get polyps if you:   Eat a lot of fatty foods   Smoke   Drink alcohol   Do not exercise  Eat too much   PREVENTION There is not one sure way to prevent polyps. You might be able to lower your risk of getting them if you:  Eat more fruits and vegetables and less fatty food.   Do not smoke.   Avoid alcohol.   Exercise every day.   Lose weight if you are overweight.   Eating more calcium and folate can also lower your risk of getting polyps. Some  foods that are rich in calcium are milk, cheese, and broccoli. Some foods that are rich in folate are chickpeas, kidney beans, and spinach.

## 2019-02-06 NOTE — Op Note (Signed)
[facility] Patient Name: Melanie Brown Procedure Date: 02/06/2019 11:47 AM MRN: [id number] Date of Birth: 1951/06/29 Attending MD: Barney Drain MD, MD CSN: [id number] Age: 68 Admit Type: Outpatient Procedure:                ColonoscopyWITH COLD SNARE/FORCEPS POLYPECTOMY Indications:              Personal history of colonic polyps Providers:                Barney Drain MD, MD, Janeece Riggers, RN, Nelma Rothman,                            Technician Referring MD:             [name] Medicines:                Propofol per Anesthesia Complications:            No immediate complications. Estimated Blood Loss:     Estimated blood loss was minimal. Procedure:                Pre-Anesthesia Assessment:                           - Prior to the procedure, a History and Physical                            was performed, and patient medications and                            allergies were reviewed. The patient's tolerance of                            previous anesthesia was also reviewed. The risks                            and benefits of the procedure and the sedation                            options and risks were discussed with the patient.                            All questions were answered, and informed consent                            was obtained. Prior Anticoagulants: The patient has                            taken no previous anticoagulant or antiplatelet                            agents. ASA Grade Assessment: II - A patient with                            mild systemic disease. After reviewing the risks  and benefits, the patient was deemed in                            satisfactory condition to undergo the procedure.                            After obtaining informed consent, the colonoscope                            was passed under direct vision. Throughout the                            procedure, the patient's blood pressure, pulse, and                             oxygen saturations were monitored continuously. The                            [id number]-[id number] ([id number]) scope was introduced through                            the anus and advanced to the the cecum, identified                            by appendiceal orifice and ileocecal valve. The                            colonoscopy was somewhat difficult due to a                            tortuous colon. Successful completion of the                            procedure was aided by straightening and shortening                            the scope to obtain bowel loop reduction and                            COLOWRAP. The patient tolerated the procedure well.                            The quality of the bowel preparation was excellent.                            The ileocecal valve, appendiceal orifice, and                            rectum were photographed. Scope In: 12:03:07 PM Scope Out: 12:23:05 PM Scope Withdrawal Time: 0 hours 17 minutes 25 seconds  Total Procedure Duration: 0 hours 19 minutes 58 seconds  Findings:      A 2 mm polyp was found in the cecum. The polyp was sessile. The polyp  was removed with a cold biopsy forceps. Resection and retrieval were       complete.      Four sessile polyps were found in the transverse colon, hepatic flexure,       ascending colon and cecum. The polyps were 3 to 5 mm in size.      Multiple small and large-mouthed diverticula were found in the       recto-sigmoid colon and sigmoid colon.      External and internal hemorrhoids were found. Impression:               - One 2 mm polyp in the cecum, removed with a cold                            biopsy forceps. Resected and retrieved.                           - Four 3 to 5 mm polyps in the transverse colon, at                            the hepatic flexure, in the ascending colon and in                            the cecum.                           - MODERATE Diverticulosis in  the recto-sigmoid                            colon and in the sigmoid colon.                           - External and internal hemorrhoids. Moderate Sedation:      Per Anesthesia Care Recommendation:           - Patient has a contact number available for                            emergencies. The signs and symptoms of potential                            delayed complications were discussed with the                            patient. Return to normal activities tomorrow.                            Written discharge instructions were provided to the                            patient.                           - High fiber diet.                           - Continue present medications.                           -  Await pathology results.                           - Repeat colonoscopy in 3 years for surveillance. Procedure Code(s):        --- Professional ---                           [phone], Colonoscopy, flexible; with biopsy, single                            or multiple Diagnosis Code(s):        --- Professional ---                           D12.0, Benign neoplasm of cecum                           D12.3, Benign neoplasm of transverse colon (hepatic                            flexure or splenic flexure)                           D12.2, Benign neoplasm of ascending colon                           K64.8, Other hemorrhoids                           Z86.010, Personal history of colonic polyps                           K57.30, Diverticulosis of large intestine without                            perforation or abscess without bleeding CPT copyright 2018 American Medical Association. All rights reserved. The codes documented in this report are preliminary and upon coder review may  be revised to meet current compliance requirements. Barney Drain, MD Barney Drain MD, MD 02/06/2019 12:35:05 PM This report has been signed electronically. Number of Addenda: 0

## 2019-02-06 NOTE — Anesthesia Postprocedure Evaluation (Signed)
Anesthesia Post Note  Patient: Melanie Brown  Procedure(s) Performed: COLONOSCOPY WITH PROPOFOL (N/A ) BIOPSY POLYPECTOMY  Patient location during evaluation: PACU Anesthesia Type: General Level of consciousness: awake and alert and oriented Pain management: pain level controlled Vital Signs Assessment: post-procedure vital signs reviewed and stable Respiratory status: spontaneous breathing Cardiovascular status: blood pressure returned to baseline and stable Postop Assessment: no apparent nausea or vomiting Anesthetic complications: no     Last Vitals:  Vitals:   02/06/19 1036  BP: (!) 153/75  Pulse: 69  Resp: 20  Temp: 36.4 C  SpO2: 100%    Last Pain:  Vitals:   02/06/19 1154  PainSc: 0-No pain                 Glenna Brunkow

## 2019-02-06 NOTE — H&P (Signed)
Primary Care Physician:  [name], MD Primary Gastroenterologist:  Dr. Oneida Alar  Pre-Procedure History & Physical: HPI:  Melanie Brown is a 68 y.o. female here for  PERSONAL HISTORY OF POLYPS.  Past Medical History:  Diagnosis Date  . Anxiety   . Asthma   . Dyspnea   . Fibromyalgia   . GERD (gastroesophageal reflux disease)   . Headache(784.0)   . IBS (irritable bowel syndrome)   . Obesity, morbid ([place])    2016: 119 kg    Past Surgical History:  Procedure Laterality Date  . ABDOMINAL HYSTERECTOMY    . CESAREAN SECTION    . CHOLECYSTECTOMY    . COLONOSCOPY N/A 12/17/2015   Dr. Oneida Alar: multiple colon polyps, tubular adenomas and hyperplastic, sigmoid colon and descending colon diverticulosis, benign colonic mucosa on path, hemorrhoids. Surveillance in 2019 with overtube and MAC   . ESOPHAGOGASTRODUODENOSCOPY N/A 12/17/2015   Dr. Oneida Alar: multiple small gastric polyps, mild non-erosive gastritis, small hiatal hernia, negative celiac sprue  . SAVORY DILATION N/A 12/17/2015   Procedure: SAVORY DILATION;  Surgeon: Danie Binder, MD;  [facility];  Service: Endoscopy;  Laterality: N/A;  . SINUS ENDO WITH FUSION  08/09/2012   Procedure: SINUS ENDO WITH FUSION;  Surgeon: Melida Quitter, MD;  Location: [facility];  Service: ENT;  Laterality: Bilateral;  Bilateral Frontal Recess Exploration  . SPHENOIDECTOMY  08/09/2012   Procedure: SPHENOIDECTOMY;  Surgeon: Melida Quitter, MD;  Location: [place];  Service: ENT;  Laterality: Bilateral;  . TONSILLECTOMY      Prior to Admission medications   Medication Sig Start Date End Date Taking? Authorizing Provider  cetirizine (ZYRTEC) 10 MG tablet Take 10 mg by mouth daily.   Yes [provider]  Cholecalciferol (VITAMIN D3) 250 MCG (10000 UT) TABS Take 10,000 Units by mouth daily.   Yes [provider]  clotrimazole-betamethasone (LOTRISONE) cream Apply 1 application topically 2 (two) times daily as needed (skin irritation).   10/08/18  Yes [provider]  dicyclomine (BENTYL) 10 MG capsule Take 1 capsule (10 mg total) by mouth 4 (four) times daily as needed (diarrhea/IBS). 02/05/19  Yes Carlis Stable, NP  Fluticasone Propionate (XHANCE) 93 MCG/ACT EXHU Place 2 sprays into the nose daily. 10/26/18  Yes Valentina Shaggy, MD  fluticasone-salmeterol (ADVAIR HFA) 9258121820 MCG/ACT inhaler TAKE 2 PUFFS BY MOUTH TWICE A DAY 01/18/19  Yes Valentina Shaggy, MD  Multiple Vitamin (MULTIVITAMIN WITH MINERALS) TABS tablet Take 1 tablet by mouth daily. Centrum Silver   Yes [provider]  omeprazole (PRILOSEC) 40 MG capsule Take 1 capsule (40 mg total) by mouth daily. 04/03/18  Yes Annitta Needs, NP  SUMAtriptan (IMITREX) 100 MG tablet Take 100 mg by mouth every 2 (two) hours as needed for migraine.  08/23/17  Yes [provider]  albuterol (PROVENTIL HFA;VENTOLIN HFA) 108 (90 Base) MCG/ACT inhaler Inhale into the lungs every 6 (six) hours as needed for wheezing or shortness of breath.    [provider]  Carboxymeth-Glyc-Polysorb PF (REFRESH OPTIVE MEGA-3) 0.5-1-0.5 % SOLN Place 1 drop into both eyes 3 (three) times daily as needed (RED EYES/IRRITATION.).    [provider]  FASENRA 30 MG/ML SOSY THIRD SHIP: INJECT ONE SYRINGE UNDER THE SKIN ON WEEK 8, THEN EVERY 8WEEKS THEREAFTER. Patient taking differently: Inject 1 Dose into the skin every 8 (eight) weeks.  06/02/18   Kozlow, Donnamarie Poag, MD  ipratropium-albuterol (DUONEB) 0.5-2.5 (3) MG/3ML SOLN Take 3 mLs by nebulization every  4 (four) hours as needed. Patient taking differently: Take 3 mLs by nebulization every 4 (four) hours as needed (CONGESTION).  10/20/16   Valentina Shaggy, MD  Respiratory Therapy Supplies (NEBULIZER) DEVI Use As Directed 07/19/17   Valentina Shaggy, MD  SPIRIVA RESPIMAT 1.25 MCG/ACT AERS INHALE 2 PUFFS INTO THE LUNGS DAILY. Patient taking differently: Inhale 2 puffs into the lungs daily as needed  (congestion).  03/13/18   Valentina Shaggy, MD    Allergies as of 11/02/2018 - Review Complete 11/02/2018  Allergen Reaction Noted  . Aspirin Shortness Of Breath and Rash 05/23/2008  . Chloramphenicols Shortness Of Breath 12/28/2013  . Procaine hcl Other (See Comments) 05/23/2008  . Penicillins Rash 05/23/2008    Family History  Problem Relation Age of Onset  . Colon cancer Father 71       COLON RESECTED  . Colon polyps Neg Hx   . Allergic rhinitis Neg Hx   . Angioedema Neg Hx   . Asthma Neg Hx   . Atopy Neg Hx   . Eczema Neg Hx   . Immunodeficiency Neg Hx   . Urticaria Neg Hx     Social History   Socioeconomic History  . Marital status: Single    Spouse name: Not on file  . Number of children: 2  . Years of education: college  . Highest education level: Not on file  Occupational History  . Occupation: [place] [employer]: [employer]  . Financial resource strain: Not on file  . Food insecurity:    Worry: Not on file    Inability: Not on file  . Transportation needs:    Medical: Not on file    Non-medical: Not on file  Tobacco Use  . Smoking status: Never Smoker  . Smokeless tobacco: Never Used  Substance and Sexual Activity  . Alcohol use: No  . Drug use: No  . Sexual activity: Not on file  Lifestyle  . Physical activity:    Days per week: Not on file    Minutes per session: Not on file  . Stress: Not on file  Relationships  . Social connections:    Talks on phone: Not on file    Gets together: Not on file    Attends religious service: Not on file    Active member of club or organization: Not on file    Attends meetings of clubs or organizations: Not on file    Relationship status: Not on file  . Intimate partner violence:    Fear of current or ex partner: Not on file    Emotionally abused: Not on file    Physically abused: Not on file    Forced sexual activity: Not on file  Other Topics Concern  . Not on  file  Social History Narrative  . Not on file    Review of Systems: See HPI, otherwise negative ROS   Physical Exam: BP (!) 153/75   Pulse 69   Temp 97.6 F (36.4 C)   Resp 20   Ht _0  (1.575 m)   Wt 119.3 kg   SpO2 100%   BMI 48.10 kg/m  General:   Alert,  pleasant and cooperative in NAD Head:  Normocephalic and atraumatic. Neck:  Supple; Lungs:  Clear throughout to auscultation.    Heart:  Regular rate and rhythm. Abdomen:  Soft, nontender and nondistended. Normal bowel sounds, without guarding, and without rebound.   Neurologic:  Alert and  oriented x4;  grossly normal neurologically.  Impression/Plan:     PERSONAL HISTORY OF POLYPS.  PLAN: 1. TCS TODAY. DISCUSSED PROCEDURE, BENEFITS, & RISKS: < 1% chance of medication reaction, bleeding, perforation, or rupture of spleen/liver.

## 2019-02-08 ENCOUNTER — Encounter (HOSPITAL_COMMUNITY): Payer: Self-pay | Admitting: Gastroenterology

## 2019-02-08 NOTE — Progress Notes (Signed)
PT is aware.

## 2019-05-03 ENCOUNTER — Ambulatory Visit (INDEPENDENT_AMBULATORY_CARE_PROVIDER_SITE_OTHER): Payer: Medicare Other | Admitting: Gastroenterology

## 2019-05-03 ENCOUNTER — Encounter: Payer: Self-pay | Admitting: Gastroenterology

## 2019-05-03 ENCOUNTER — Ambulatory Visit: Payer: BC Managed Care – PPO | Admitting: Nurse Practitioner

## 2019-05-03 ENCOUNTER — Other Ambulatory Visit: Payer: Self-pay

## 2019-05-03 DIAGNOSIS — K580 Irritable bowel syndrome with diarrhea: Secondary | ICD-10-CM | POA: Diagnosis not present

## 2019-05-03 DIAGNOSIS — K589 Irritable bowel syndrome without diarrhea: Secondary | ICD-10-CM | POA: Insufficient documentation

## 2019-05-03 MED ORDER — DICYCLOMINE HCL 10 MG PO CAPS: 10.0000 mg | ORAL_CAPSULE | Freq: Four times a day (QID) | ORAL | 3 refills | Status: DC | PRN

## 2019-05-03 MED ORDER — OMEPRAZOLE 40 MG PO CPDR: 40.0000 mg | DELAYED_RELEASE_CAPSULE | Freq: Every day | ORAL | 3 refills | Status: DC

## 2019-05-03 NOTE — Patient Instructions (Signed)
I have refilled Bentyl and Prilosec.  We will see you in 1 year or sooner if needed!  It was great to "see" you today. I am so sorry for your loss.   Please let us know if you need anything in the meantime!  I enjoyed seeing you again today! As you know, I value our relationship and want to provide genuine, compassionate, and quality care. I welcome your feedback. If you receive a survey regarding your visit,  I greatly appreciate you taking time to fill this out. See you next time!  Annitta Needs, PhD, ANP-BC [facility]

## 2019-05-03 NOTE — Progress Notes (Signed)
CC'D TO PCP °

## 2019-05-03 NOTE — Progress Notes (Signed)
Primary Care Physician:  [name], MD  Primary GI: Dr. Oneida Alar   Virtual Visit via Telephone Note Due to COVID-19, visit is conducted virtually and was requested by patient.   I connected with Melanie Brown on 05/03/19 at 10:00 AM EDT by telephone and verified that I am speaking with the correct person using two identifiers.   I discussed the limitations, risks, security and privacy concerns of performing an evaluation and management service by telephone and the availability of in person appointments. I also discussed with the patient that there may be a patient responsible charge related to this service. The patient expressed understanding and agreed to proceed.  Chief Complaint  Patient presents with  . Irritable Bowel Syndrome    f/u, doing ok     History of Present Illness: Very pleasant 68 year old female with history of IBS-D, presenting in routine follow-up. Colonoscopy in interim with polyps and need for surveillance in 3 years.   IBS-D: last month has been rough. Brother and sister-in-law passed away within a few weeks of each other. Bentyl 1 capsule about 3 times a day when IBS acting up, otherwise just once a day. Abdominal cramping if IBS acting up. Appetite comes and goes within the last month. Omeprazole controls GERD symptoms. No dysphagia.   Moved to [place], [place] to be near family. Would like to still come here if possible.   Past Medical History:  Diagnosis Date  . Anxiety   . Asthma   . Dyspnea   . Fibromyalgia   . GERD (gastroesophageal reflux disease)   . Headache(784.0)   . IBS (irritable bowel syndrome)   . Obesity, morbid ([place])    2016: 119 kg     Past Surgical History:  Procedure Laterality Date  . ABDOMINAL HYSTERECTOMY    . BIOPSY  02/06/2019   Procedure: BIOPSY;  Surgeon: Danie Binder, MD;  [facility];  Service: Endoscopy;;  cecal polyp cold biopsy   . CESAREAN SECTION    . CHOLECYSTECTOMY    . COLONOSCOPY N/A  12/17/2015   Dr. Oneida Alar: multiple colon polyps, tubular adenomas and hyperplastic, sigmoid colon and descending colon diverticulosis, benign colonic mucosa on path, hemorrhoids. Surveillance in 2019 with overtube and MAC   . COLONOSCOPY WITH PROPOFOL N/A 02/06/2019   2 mm polyp in cecum, four sessile polyps in transverse, hepatic, ascending, and cecum, 3-5 mm in size. Multiple small and large-mouthed diverticula in rectosigmoid and sigmoid, external and internal hemorrhoids. 3 years surveillance. (3 simple adenomas and 2 benign polypoid lesions).  . ESOPHAGOGASTRODUODENOSCOPY N/A 12/17/2015   Dr. Oneida Alar: multiple small gastric polyps, mild non-erosive gastritis, small hiatal hernia, negative celiac sprue  . POLYPECTOMY  02/06/2019   Procedure: POLYPECTOMY;  Surgeon: Danie Binder, MD;  [facility];  Service: Endoscopy;;  colon  . SAVORY DILATION N/A 12/17/2015   Procedure: SAVORY DILATION;  Surgeon: Danie Binder, MD;  [facility];  Service: Endoscopy;  Laterality: N/A;  . SINUS ENDO WITH FUSION  08/09/2012   Procedure: SINUS ENDO WITH FUSION;  Surgeon: Melida Quitter, MD;  Location: [facility];  Service: ENT;  Laterality: Bilateral;  Bilateral Frontal Recess Exploration  . SPHENOIDECTOMY  08/09/2012   Procedure: SPHENOIDECTOMY;  Surgeon: Melida Quitter, MD;  Location: [place];  Service: ENT;  Laterality: Bilateral;  . TONSILLECTOMY       Current Meds  Medication Sig  . albuterol (PROVENTIL HFA;VENTOLIN HFA) 108 (90 Base) MCG/ACT inhaler Inhale  into the lungs every 6 (six) hours as needed for wheezing or shortness of breath.  . Carboxymeth-Glyc-Polysorb PF (REFRESH OPTIVE MEGA-3) 0.5-1-0.5 % SOLN Place 1 drop into both eyes 3 (three) times daily as needed (RED EYES/IRRITATION.).  Marland Kitchen cetirizine (ZYRTEC) 10 MG tablet Take 10 mg by mouth daily.  . Cholecalciferol (VITAMIN D3) 250 MCG (10000 UT) TABS Take 10,000 Units by mouth daily.  . clotrimazole-betamethasone (LOTRISONE) cream  Apply 1 application topically 2 (two) times daily as needed (skin irritation).   Marland Kitchen dicyclomine (BENTYL) 10 MG capsule Take 1 capsule (10 mg total) by mouth 4 (four) times daily as needed (diarrhea/IBS).  . Fluticasone Propionate (XHANCE) 93 MCG/ACT EXHU Place 2 sprays into the nose daily.  . fluticasone-salmeterol (ADVAIR HFA) 115-21 MCG/ACT inhaler TAKE 2 PUFFS BY MOUTH TWICE A DAY  . ipratropium-albuterol (DUONEB) 0.5-2.5 (3) MG/3ML SOLN Take 3 mLs by nebulization every 4 (four) hours as needed. (Patient taking differently: Take 3 mLs by nebulization every 4 (four) hours as needed (CONGESTION). )  . Multiple Vitamin (MULTIVITAMIN WITH MINERALS) TABS tablet Take 1 tablet by mouth daily. Centrum Silver  . omeprazole (PRILOSEC) 40 MG capsule Take 1 capsule (40 mg total) by mouth daily.  Marland Kitchen Respiratory Therapy Supplies (NEBULIZER) DEVI Use As Directed  . SPIRIVA RESPIMAT 1.25 MCG/ACT AERS INHALE 2 PUFFS INTO THE LUNGS DAILY.  . SUMAtriptan (IMITREX) 100 MG tablet Take 100 mg by mouth every 2 (two) hours as needed for migraine.   . [DISCONTINUED] dicyclomine (BENTYL) 10 MG capsule Take 1 capsule (10 mg total) by mouth 4 (four) times daily as needed (diarrhea/IBS).  . [DISCONTINUED] omeprazole (PRILOSEC) 40 MG capsule Take 1 capsule (40 mg total) by mouth daily.     Family History  Problem Relation Age of Onset  . Colon cancer Father 55       COLON RESECTED  . Colon polyps Neg Hx   . Allergic rhinitis Neg Hx   . Angioedema Neg Hx   . Asthma Neg Hx   . Atopy Neg Hx   . Eczema Neg Hx   . Immunodeficiency Neg Hx   . Urticaria Neg Hx     Social History   Socioeconomic History  . Marital status: Single    Spouse name: Not on file  . Number of children: 2  . Years of education: college  . Highest education level: Not on file  Occupational History  . Occupation: [employer]: [employer]  . Financial resource strain: Not on file  . Food  insecurity:    Worry: Not on file    Inability: Not on file  . Transportation needs:    Medical: Not on file    Non-medical: Not on file  Tobacco Use  . Smoking status: Never Smoker  . Smokeless tobacco: Never Used  Substance and Sexual Activity  . Alcohol use: No  . Drug use: No  . Sexual activity: Not on file  Lifestyle  . Physical activity:    Days per week: Not on file    Minutes per session: Not on file  . Stress: Not on file  Relationships  . Social connections:    Talks on phone: Not on file    Gets together: Not on file    Attends religious service: Not on file    Active member of club or organization: Not on file    Attends meetings of clubs or organizations: Not on file  Relationship status: Not on file  Other Topics Concern  . Not on file  Social History Narrative  . Not on file       Review of Systems: Gen: Denies fever, chills, anorexia. Denies fatigue, weakness, weight loss.  CV: Denies chest pain, palpitations, syncope, peripheral edema, and claudication. Resp: Denies dyspnea at rest, cough, wheezing, coughing up blood, and pleurisy. GI: see HPI Derm: Denies rash, itching, dry skin Psych: Denies depression, anxiety, memory loss, confusion. No homicidal or suicidal ideation.  Heme: Denies bruising, bleeding, and enlarged lymph nodes.  Observations/Objective: No distress. Pleasant and cooperative.   Assessment and Plan: 68 year old female with history of IBS-D, still managed well with Bentyl at low doses. Unfortunately, her brother and sister in law just passed away within several weeks of each other, so she has noticed a flare in IBS symptoms. Colonoscopy recently on file and due for surveillance in 3 years. GERD well managed with omeprazole.  As she has moved to [place], [place], I have refilled Bentyl and omeprazole at her local pharmacy. She would like to continue coming here if possible. We will see her back in 1 year or sooner if needed.     Follow  Up Instructions:    I discussed the assessment and treatment plan with the patient. The patient was provided an opportunity to ask questions and all were answered. The patient agreed with the plan and demonstrated an understanding of the instructions.   The patient was advised to call back or seek an in-person evaluation if the symptoms worsen or if the condition fails to improve as anticipated.  I provided 15 minutes of face-to-face time during this video encounter.  Annitta Needs, PhD, ANP-BC [facility]

## 2019-05-04 ENCOUNTER — Other Ambulatory Visit: Payer: Self-pay | Admitting: Gastroenterology

## 2019-06-11 ENCOUNTER — Telehealth: Payer: Self-pay | Admitting: Allergy & Immunology

## 2019-06-11 MED ORDER — IPRATROPIUM-ALBUTEROL 0.5-2.5 (3) MG/3ML IN SOLN: 3.0000 mL | RESPIRATORY_TRACT | 0 refills | Status: DC | PRN

## 2019-06-11 MED ORDER — XHANCE 93 MCG/ACT NA EXHU: 2.0000 | INHALANT_SUSPENSION | Freq: Every day | NASAL | 0 refills | Status: DC

## 2019-06-11 MED ORDER — ALBUTEROL SULFATE HFA 108 (90 BASE) MCG/ACT IN AERS: 2.0000 | INHALATION_SPRAY | RESPIRATORY_TRACT | 0 refills | Status: DC | PRN

## 2019-06-11 NOTE — Telephone Encounter (Signed)
I received an email from the patient requesting a follow-up appointment.  We will do a telephone visit at 4:30 PM on June 23. She is requesting a refill of her medications.  I will send in a 1 month supply now and do another 5 months after our visit tomorrow.  Salvatore Marvel, MD [facility]

## 2019-06-12 ENCOUNTER — Encounter: Payer: Self-pay | Admitting: Allergy & Immunology

## 2019-06-12 ENCOUNTER — Ambulatory Visit (INDEPENDENT_AMBULATORY_CARE_PROVIDER_SITE_OTHER): Payer: Medicare Other | Admitting: Allergy & Immunology

## 2019-06-12 DIAGNOSIS — J45909 Unspecified asthma, uncomplicated: Secondary | ICD-10-CM

## 2019-06-12 DIAGNOSIS — J339 Nasal polyp, unspecified: Secondary | ICD-10-CM

## 2019-06-12 DIAGNOSIS — J4550 Severe persistent asthma, uncomplicated: Secondary | ICD-10-CM

## 2019-06-12 DIAGNOSIS — J3089 Other allergic rhinitis: Secondary | ICD-10-CM

## 2019-06-12 DIAGNOSIS — Z886 Allergy status to analgesic agent status: Secondary | ICD-10-CM

## 2019-06-12 MED ORDER — CETIRIZINE HCL 10 MG PO TABS: 10.0000 mg | ORAL_TABLET | Freq: Every day | ORAL | 5 refills | Status: DC

## 2019-06-12 MED ORDER — SPIRIVA RESPIMAT 1.25 MCG/ACT IN AERS: 2.0000 | INHALATION_SPRAY | Freq: Every day | RESPIRATORY_TRACT | 1 refills | Status: DC

## 2019-06-12 MED ORDER — OMEPRAZOLE 40 MG PO CPDR: 40.0000 mg | DELAYED_RELEASE_CAPSULE | Freq: Every day | ORAL | 1 refills | Status: DC

## 2019-06-12 MED ORDER — ADVAIR HFA 115-21 MCG/ACT IN AERO: INHALATION_SPRAY | RESPIRATORY_TRACT | 1 refills | Status: DC

## 2019-06-12 MED ORDER — XHANCE 93 MCG/ACT NA EXHU: 2.0000 | INHALANT_SUSPENSION | Freq: Two times a day (BID) | NASAL | 11 refills | Status: DC

## 2019-06-12 NOTE — Progress Notes (Signed)
RE: Melanie Brown MRN: [id number] DOB: 1951-03-18 Date of Telemedicine Visit: 06/12/2019  Referring provider: [name], MD Primary care provider: [name], MD  Chief Complaint: Asthma   Telemedicine Follow Up Visit via Telephone: I connected with Melanie Brown for a follow up on 06/13/19 by telephone and verified that I am speaking with the correct person using two identifiers.   I discussed the limitations, risks, security and privacy concerns of performing an evaluation and management service by telephone and the availability of in person appointments. I also discussed with the patient that there may be a patient responsible charge related to this service. The patient expressed understanding and agreed to proceed.  Patient is at home accompanied.  Provider is at the office.  Visit start time: 4:24 PM Visit end time: 4:41 PM Insurance consent/check in by: [employer] consent and medical assistant/nurse: Melanie Brown  History of Present Illness:  She is a 68 y.o. female, who is being followed for aspirin exacerbated respiratory disease as well as perennial allergic rhinitis. Her previous allergy office visit was in January 2020 with Melanie Brown.  At the last visit, she was continuing to receive Fasenra every 8 weeks.  However, she had transition to Medicare in the co-pay for each injection was going to be $200 which was not affordable for her.  She was doing clinically better, but she felt that a lot of that was because of the use of the [place].  Therefore, she preferred to stop the injections to see if the nasal spray alone would continue with her clinical improvement.  We continued Advair 230/21 2 puffs twice daily as well as albuterol as needed for flares.  She also has DuoNeb that she adds on during flares as well.  For her allergic rhinitis, we continued the nasal spray as above and cetirizine 10 mg daily.  In the interim, she moved to the [place] part [place] the state.  She  ended up following her daughter who moved out that way as well.  She feels very fortunate to have moved, since she would be a unable to visit her daughter during the COVID-19 pandemic if she still lived in [place]. Clinically, she is doing fantastic.  Asthma/Respiratory Symptom History: She reports that over the last week she has developed a tight feeling in her throat more than her chest.  However, prior to this, Melanie Brown's asthma has been well controlled. She has not required rescue medication, experienced nocturnal awakenings due to lower respiratory symptoms, nor have activities of daily living been limited. She has required no Emergency Department or Urgent Care visits for her asthma. She has required zero courses of systemic steroids for asthma exacerbations since the last visit. ACT score today is 25, indicating excellent asthma symptom control.  She remains on Advair 230/21 mcg 2 puffs twice daily.  She does use a spacer.  She has been using the DuoNeb just over the last week, but otherwise she does not remember the last time that she used it.  Allergic Rhinitis Symptom History: She remains on Xhance two sprays per nostril once daily. She is also on the cetirizine 10mg  daily.  She is currently paying $44 per month for the [facility].  She tells me that this is affordable.  She has not required any antibiotics.  She has had no episodes of sinusitis, pneumonia, or bronchitis since last time we saw her.  She does not feel that her nasal polyps have been a huge issue.  She  is looking into bariatric surgery.  In fact, she is meeting with the surgeon next week.  She has gone through psychological counseling as well as a nutritional consult and all the other steps that are required to do bariatric surgery.  She is looking forward to getting this done.  All of this is coordinated in [place], [place], which is around 2 hours away.  She lost her brother in March and then four weeks later the spouse  died. They are going to do a memorial this summer since he cannot do an in person funeral during the COVID-19 pandemic.  Therefore, she will be in the [place] area for a week or 2.  Otherwise, there have been no changes to her past medical history, surgical history, family history, or social history.  Assessment and Plan:  Melanie Brown is a 68 y.o. female with:  Severe persistent asthma without complication  History of nasal polyposis  Perennial allergic rhinitis(molds)  Aspirin intolerance  Samter's triad   Asthma Reportables: Severity:severe persistent Risk:high Control:not well controlled    1. Chronic allergic rhinitis - Continue with cetirizine 10mg  daily. - Continue with Xhance two spray per nostril up to twice daily.  - We can give you samples when you come back to [place].   2. Severe persistent asthma, uncomplicated with coexisting aspirin exacerbated respiratory disease (Samter's Triad) - We are going to work on ordering a pulmonary function test in [place].  - That way we can provide more guidance regarding clearance for the surgery.  - Daily controller medication(s): Advair 230/21 two puffs twice daily WITH SPACER - Rescue medications: ProAir 4 puffs every 4-6 hours as needed or albuterol nebulizer one vial puffs every 4-6 hours as needed - Changes during respiratory infections or worsening symptoms: add Spiriva two puffs once daily - Asthma control goals:  * Full participation in all desired activities (may need albuterol before activity) * Albuterol use two time or less a week on average (not counting use with activity) * Cough interfering with sleep two time or less a month * Oral steroids no more than once a year * No hospitalizations  3. Return in about 6 months (around 12/12/2019). This can be an in-person, a virtual Webex or a telephone follow up visit.    Diagnostics: None.  Medication List:  Current Outpatient Medications   Medication Sig Dispense Refill  . albuterol (VENTOLIN HFA) 108 (90 Base) MCG/ACT inhaler Inhale 2 puffs into the lungs every 4 (four) hours as needed for wheezing or shortness of breath. 18 g 0  . Carboxymeth-Glyc-Polysorb PF (REFRESH OPTIVE MEGA-3) 0.5-1-0.5 % SOLN Place 1 drop into both eyes 3 (three) times daily as needed (RED EYES/IRRITATION.).    Marland Kitchen cetirizine (ZYRTEC) 10 MG tablet Take 1 tablet (10 mg total) by mouth daily. 30 tablet 5  . Cholecalciferol (VITAMIN D3) 250 MCG (10000 UT) TABS Take 10,000 Units by mouth daily.    . clotrimazole-betamethasone (LOTRISONE) cream Apply 1 application topically 2 (two) times daily as needed (skin irritation).   4  . dicyclomine (BENTYL) 10 MG capsule Take 1 capsule (10 mg total) by mouth 4 (four) times daily as needed (diarrhea/IBS). 360 capsule 3  . Fluticasone Propionate (XHANCE) 93 MCG/ACT EXHU Place 2 sprays into the nose 2 (two) times a day. 32 mL 11  . fluticasone-salmeterol (ADVAIR HFA) 115-21 MCG/ACT inhaler TAKE 2 PUFFS BY MOUTH TWICE A DAY 3 Inhaler 1  . ipratropium-albuterol (DUONEB) 0.5-2.5 (3) MG/3ML SOLN Take 3 mLs by nebulization every  4 (four) hours as needed for up to 30 days (CONGESTION, COUGH). 90 mL 0  . Multiple Vitamin (MULTIVITAMIN WITH MINERALS) TABS tablet Take 1 tablet by mouth daily. Centrum Silver    . omeprazole (PRILOSEC) 40 MG capsule Take 1 capsule (40 mg total) by mouth daily. 90 capsule 1  . Respiratory Therapy Supplies (NEBULIZER) DEVI Use As Directed 1 each 0  . SUMAtriptan (IMITREX) 100 MG tablet Take 100 mg by mouth every 2 (two) hours as needed for migraine.   6  . Tiotropium Bromide Monohydrate (SPIRIVA RESPIMAT) 1.25 MCG/ACT AERS Inhale 2 puffs into the lungs daily. 12 g 1   No current facility-administered medications for this visit.    Allergies: Allergies  Allergen Reactions  . Aspirin Shortness Of Breath and Rash  . Chloramphenicols Shortness Of Breath    Felt "whoozy"  . Procaine Hcl Other (See  Comments)     muscle spasm of neck  . Penicillins Rash    Did it involve swelling of the face/tongue/throat, SOB, or low BP? Yes Did it involve sudden or severe rash/hives, skin peeling, or any reaction on the inside of your mouth or nose? Yes Did you need to seek medical attention at a hospital or doctor's office? No When did it last happen?more than 30 years If all above answers are "NO", may proceed with cephalosporin use.    I reviewed her past medical history, social history, family history, and environmental history and no significant changes have been reported from previous visits.  Review of Systems  Constitutional: Negative for activity change, appetite change, chills and fever.  HENT: Negative for congestion, postnasal drip, rhinorrhea, sinus pressure and sore throat.   Eyes: Negative for pain, discharge, redness and itching.  Respiratory: Negative for apnea, cough, shortness of breath, wheezing and stridor.   Gastrointestinal: Negative for diarrhea, nausea and vomiting.  Endocrine: Negative for cold intolerance and heat intolerance.  Musculoskeletal: Negative for arthralgias, joint swelling and myalgias.  Skin: Negative for rash.  Allergic/Immunologic: Negative for environmental allergies and food allergies.    Objective:  Physical exam not obtained as encounter was done via telephone.   Previous notes and tests were reviewed.  I discussed the assessment and treatment plan with the patient. The patient was provided an opportunity to ask questions and all were answered. The patient agreed with the plan and demonstrated an understanding of the instructions.   The patient was advised to call back or seek an in-person evaluation if the symptoms worsen or if the condition fails to improve as anticipated.  I provided 17 minutes of non-face-to-face time during this encounter.  It was my pleasure to participate in [facility] care today. Please feel free to contact  me with any questions or concerns.   Sincerely,  Valentina Shaggy, MD

## 2019-06-12 NOTE — Patient Instructions (Addendum)
1. Chronic allergic rhinitis - Continue with cetirizine 10mg  daily. - Continue with Xhance two spray per nostril up to twice daily.  - We can give you samples when you come back to [place].   2. Severe persistent asthma, uncomplicated with coexisting aspirin exacerbated respiratory disease (Samter's Triad) - We are going to work on ordering a pulmonary function test in [place].  - That way we can provide more guidance regarding clearance for the surgery.  - Daily controller medication(s): Advair 230/21 two puffs twice daily WITH SPACER - Rescue medications: ProAir 4 puffs every 4-6 hours as needed or albuterol nebulizer one vial puffs every 4-6 hours as needed - Changes during respiratory infections or worsening symptoms: add Spiriva two puffs once daily - Asthma control goals:  * Full participation in all desired activities (may need albuterol before activity) * Albuterol use two time or less a week on average (not counting use with activity) * Cough interfering with sleep two time or less a month * Oral steroids no more than once a year * No hospitalizations  3. Return in about 6 months (around 12/12/2019). This can be an in-person, a virtual Webex or a telephone follow up visit.   Please inform us of any Emergency Department visits, hospitalizations, or changes in symptoms. Call us before going to the ED for breathing or allergy symptoms since we might be able to fit you in for a sick visit. Feel free to contact us anytime with any questions, problems, or concerns.  It was a pleasure to talk to you today today!  Websites that have reliable patient information: 1. American Academy of Asthma, Allergy, and Immunology: www.aaaai.org 2. Food Allergy Research and Education (FARE): foodallergy.org 3. Mothers of Asthmatics: http://www.asthmacommunitynetwork.org 4. American College of Allergy, Asthma, and Immunology: www.acaai.org  "Like" [place] on Facebook and Instagram for our latest  updates!      Make sure you are registered to vote! If you have moved or changed any of your contact information, you will need to get this updated before voting!  In some cases, you MAY be able to register to vote online: [email or web address]    Voter ID laws are NOT going into effect for the General Election in November 2020! DO NOT let this stop you from exercising your right to vote!   Absentee voting is the SAFEST way to vote during the coronavirus pandemic!   Download and print an absentee ballot request form at [email or web address] or you can scan the QR code below with your smart phone:      More information on absentee ballots can be found here: [email or web address]

## 2019-06-13 ENCOUNTER — Encounter: Payer: Self-pay | Admitting: Allergy & Immunology

## 2019-06-18 NOTE — Progress Notes (Signed)
Done. Thanks.

## 2019-07-02 ENCOUNTER — Encounter: Payer: Self-pay | Admitting: Allergy & Immunology

## 2019-07-02 ENCOUNTER — Ambulatory Visit (INDEPENDENT_AMBULATORY_CARE_PROVIDER_SITE_OTHER): Payer: Medicare Other | Admitting: Allergy & Immunology

## 2019-07-02 ENCOUNTER — Telehealth: Payer: Self-pay | Admitting: Allergy & Immunology

## 2019-07-02 ENCOUNTER — Other Ambulatory Visit: Payer: Self-pay

## 2019-07-02 DIAGNOSIS — J339 Nasal polyp, unspecified: Secondary | ICD-10-CM | POA: Diagnosis not present

## 2019-07-02 DIAGNOSIS — J3089 Other allergic rhinitis: Secondary | ICD-10-CM | POA: Diagnosis not present

## 2019-07-02 DIAGNOSIS — J4550 Severe persistent asthma, uncomplicated: Secondary | ICD-10-CM

## 2019-07-02 DIAGNOSIS — J45909 Unspecified asthma, uncomplicated: Secondary | ICD-10-CM | POA: Diagnosis not present

## 2019-07-02 DIAGNOSIS — Z886 Allergy status to analgesic agent status: Secondary | ICD-10-CM

## 2019-07-02 MED ORDER — PREDNISONE 10 MG PO TABS: ORAL_TABLET | ORAL | 0 refills | Status: DC

## 2019-07-02 NOTE — Progress Notes (Signed)
RE: Melanie Brown MRN: [id number] DOB: November 04, 1951 Date of Telemedicine Visit: 07/02/2019  Referring provider: [name], MD Primary care provider: [name], MD  Chief Complaint: Asthma   Telemedicine Follow Up Visit via Telephone: I connected with Melanie Brown for a follow up on 07/02/19 by telephone and verified that I am speaking with the correct person using two identifiers.   I discussed the limitations, risks, security and privacy concerns of performing an evaluation and management service by telephone and the availability of in person appointments. I also discussed with the patient that there may be a patient responsible charge related to this service. The patient expressed understanding and agreed to proceed.  Patient is at home.  Provider is at the office.  Visit start time: 10:55 AM Visit end time: 11:11 AM2 Insurance consent/check in by:  Medical consent and medical assistant/nurse: Melanie Brown  History of Present Illness:  She is a 68 y.o. female, who is being followed for severe persistent asthma, nasal polyps, and aspirin sensitivity. Her previous allergy office visit was in June 2020 with myself. At that time, she was having some increased work of breathing which she felt was secondary to a combination of running out of her controller medications and being exposed to some damp environments. We sent in new scripts for all of her medications, including her asthma and allergic rhinitis.   Since the last visit, she has continued to have coughing and SOB despite restarting her medication. She was hopeful that she would be able to avoid prednisone with the restarting of her medications. She has not been febrile and has not had any problems with chest pain. She has been using her rescue inhaler 2-3 times per day.   Asthma/Respiratory Symptom History: She is using her Spiriva two puffs once daily and She remains on Advair two puffs twice daily. She has not needed steroids from  October through June. She was outdoors when it was raining in June and she started getting sick that day. From October through June, she has mostly done well. She has had some fibromyalgia issues but no asthma issues.  She thinks that the $200 might have been the injection fee so she is open to looking into the autoinjector instead.   Allergic Rhinitis Symptom History: She did not think that the Fasenra did much to help with her nasal polyps. She has not needed antibitoics at all. She is on the [place] which is $65 per month. She is open to other ideas since her medications add up fairly quickly. She has never been on budesonide rinses to her knowledge. She is open to [place] when we discuss changing biologics.    At the last visit, she requested clearance from [place] from a pulmonary perspective. I therefore ordered a spirometry which she did schedule locally. This is going to take place on Thursday July 16th. She will let me know when this is done to make sure that this is on my radar.   She has a slew of other specialist visits over the next six months since she is getting prepared for the gastric bypass surgery. Apparently they are timing it to make sure that the surgery will take place on December 30th before the deductibles reset.   Otherwise, there have been no changes to her past medical history, surgical history, family history, or social history.  Assessment and Plan:  [name] is a 68 y.o. female with:  Severe persistent asthma without complication  History of nasal polyposis  Perennial allergic rhinitis(molds)  Aspirin intolerance  Samter's triad   Asthma Reportables: Severity:severe persistent Risk:high Control:not well controlled   1. Chronic allergic rhinitis - Continue with cetirizine 10mg  daily. - Continue with Xhance two spray per nostril up to twice daily.  - We can give you samples when you come back to [place].    2. Severe persistent asthma,  uncomplicated with coexisting aspirin exacerbated respiratory disease (Samter's Triad) - Prednsione taper sent in today: Take 3 tabs (30mg ) twice daily for 3 days, then 2 tabs (20mg ) twice daily for 3 days, then 1 tab (10mg ) twice daily for 3 days, then STOP. - We are going to look into getting Faserna back on board and will compare the price to [place], which is also administered at home. - Dupixent has the added benefit of treating nasal polyposis, which might help you to get off of the [place].  - Daily controller medication(s): Advair 230/21 two puffs twice daily WITH SPACER - Rescue medications: ProAir 4 puffs every 4-6 hours as needed or albuterol nebulizer one vial puffs every 4-6 hours as needed - Changes during respiratory infections or worsening symptoms: add Spiriva two puffs once daily - Asthma control goals:  * Full participation in all desired activities (may need albuterol before activity) * Albuterol use two time or less a week on average (not counting use with activity) * Cough interfering with sleep two time or less a month * Oral steroids no more than once a year * No hospitalizations  3. Return in about 2 months (around 09/02/2019). This can be an in-person, a virtual Webex or a telephone follow up visit.   Diagnostics: None.  Medication List:  Current Outpatient Medications  Medication Sig Dispense Refill  . albuterol (VENTOLIN HFA) 108 (90 Base) MCG/ACT inhaler Inhale 2 puffs into the lungs every 4 (four) hours as needed for wheezing or shortness of breath. 18 g 0  . Carboxymeth-Glyc-Polysorb PF (REFRESH OPTIVE MEGA-3) 0.5-1-0.5 % SOLN Place 1 drop into both eyes 3 (three) times daily as needed (RED EYES/IRRITATION.).    Marland Kitchen cetirizine (ZYRTEC) 10 MG tablet Take 1 tablet (10 mg total) by mouth daily. 30 tablet 5  . Cholecalciferol (VITAMIN D3) 250 MCG (10000 UT) TABS Take 10,000 Units by mouth daily.    . clotrimazole-betamethasone (LOTRISONE) cream Apply 1 application  topically 2 (two) times daily as needed (skin irritation).   4  . dicyclomine (BENTYL) 10 MG capsule Take 1 capsule (10 mg total) by mouth 4 (four) times daily as needed (diarrhea/IBS). 360 capsule 3  . Fluticasone Propionate (XHANCE) 93 MCG/ACT EXHU Place 2 sprays into the nose 2 (two) times a day. 32 mL 11  . fluticasone-salmeterol (ADVAIR HFA) 115-21 MCG/ACT inhaler TAKE 2 PUFFS BY MOUTH TWICE A DAY 3 Inhaler 1  . ipratropium-albuterol (DUONEB) 0.5-2.5 (3) MG/3ML SOLN Take 3 mLs by nebulization every 4 (four) hours as needed for up to 30 days (CONGESTION, COUGH). 90 mL 0  . Multiple Vitamin (MULTIVITAMIN WITH MINERALS) TABS tablet Take 1 tablet by mouth daily. Centrum Silver    . omeprazole (PRILOSEC) 40 MG capsule Take 1 capsule (40 mg total) by mouth daily. 90 capsule 1  . predniSONE (DELTASONE) 10 MG tablet Take 3 tabs (30mg ) twice daily for 3 days, then 2 tabs (20mg ) twice daily for 3 days, then 1 tab (10mg ) twice daily for 3 days, then STOP. 36 tablet 0  . Respiratory Therapy Supplies (NEBULIZER) DEVI Use As Directed 1 each 0  . SUMAtriptan (  IMITREX) 100 MG tablet Take 100 mg by mouth every 2 (two) hours as needed for migraine.   6  . Tiotropium Bromide Monohydrate (SPIRIVA RESPIMAT) 1.25 MCG/ACT AERS Inhale 2 puffs into the lungs daily. 12 g 1   No current facility-administered medications for this visit.    Allergies: Allergies  Allergen Reactions  . Aspirin Shortness Of Breath and Rash  . Chloramphenicols Shortness Of Breath    Felt "whoozy"  . Procaine Hcl Other (See Comments)     muscle spasm of neck  . Penicillins Rash    Did it involve swelling of the face/tongue/throat, SOB, or low BP? Yes Did it involve sudden or severe rash/hives, skin peeling, or any reaction on the inside of your mouth or nose? Yes Did you need to seek medical attention at a hospital or doctor's office? No When did it last happen?more than 30 years If all above answers are "NO", may proceed with  cephalosporin use.    I reviewed her past medical history, social history, family history, and environmental history and no significant changes have been reported from previous visits.  Review of Systems  Constitutional: Negative for activity change, appetite change, chills, fatigue and fever.  HENT: Negative for congestion, postnasal drip, rhinorrhea, sinus pressure and sore throat.   Eyes: Negative for pain, discharge, redness and itching.  Respiratory: Negative for apnea, cough, shortness of breath, wheezing and stridor.   Gastrointestinal: Negative for diarrhea, nausea and vomiting.  Endocrine: Negative for cold intolerance and heat intolerance.  Musculoskeletal: Negative for arthralgias, joint swelling and myalgias.  Skin: Negative for rash.  Allergic/Immunologic: Negative for environmental allergies and food allergies.    Objective:  Physical exam not obtained as encounter was done via telephone.   Previous notes and tests were reviewed.  I discussed the assessment and treatment plan with the patient. The patient was provided an opportunity to ask questions and all were answered. The patient agreed with the plan and demonstrated an understanding of the instructions.   The patient was advised to call back or seek an in-person evaluation if the symptoms worsen or if the condition fails to improve as anticipated.  I provided 16 minutes of non-face-to-face time during this encounter.  It was my pleasure to participate in [facility] care today. Please feel free to contact me with any questions or concerns.   Sincerely,  Valentina Shaggy, MD

## 2019-07-02 NOTE — Patient Instructions (Addendum)
1. Chronic allergic rhinitis - Continue with cetirizine 10mg  daily. - Continue with Xhance two spray per nostril up to twice daily.  - We can give you samples when you come back to [place].    2. Severe persistent asthma, uncomplicated with coexisting aspirin exacerbated respiratory disease (Samter's Triad) - Prednsione taper sent in today: Take 3 tabs (30mg ) twice daily for 3 days, then 2 tabs (20mg ) twice daily for 3 days, then 1 tab (10mg ) twice daily for 3 days, then STOP. - We are going to look into getting Faserna back on board and will compare the price to [place], which is also administered at home. - Dupixent has the added benefit of treating nasal polyposis, which might help you to get off of the [place].  - Daily controller medication(s): Advair 230/21 two puffs twice daily WITH SPACER - Rescue medications: ProAir 4 puffs every 4-6 hours as needed or albuterol nebulizer one vial puffs every 4-6 hours as needed - Changes during respiratory infections or worsening symptoms: add Spiriva two puffs once daily - Asthma control goals:  * Full participation in all desired activities (may need albuterol before activity) * Albuterol use two time or less a week on average (not counting use with activity) * Cough interfering with sleep two time or less a month * Oral steroids no more than once a year * No hospitalizations  3. Return in about 2 months (around 09/02/2019). This can be an in-person, a virtual Webex or a telephone follow up visit.   Please inform us of any Emergency Department visits, hospitalizations, or changes in symptoms. Call us before going to the ED for breathing or allergy symptoms since we might be able to fit you in for a sick visit. Feel free to contact us anytime with any questions, problems, or concerns.  It was a pleasure to talk to you today today!  Websites that have reliable patient information: 1. American Academy of Asthma, Allergy, and Immunology:  www.aaaai.org 2. Food Allergy Research and Education (FARE): foodallergy.org 3. Mothers of Asthmatics: http://www.asthmacommunitynetwork.org 4. American College of Allergy, Asthma, and Immunology: www.acaai.org  "Like" [place] on Facebook and Instagram for our latest updates!      Make sure you are registered to vote! If you have moved or changed any of your contact information, you will need to get this updated before voting!  In some cases, you MAY be able to register to vote online: [email or web address]    Voter ID laws are NOT going into effect for the General Election in November 2020! DO NOT let this stop you from exercising your right to vote!   Absentee voting is the SAFEST way to vote during the coronavirus pandemic!   Download and print an absentee ballot request form at [email or web address] or you can scan the QR code below with your smart phone:      More information on absentee ballots can be found here: [email or web address]

## 2019-07-02 NOTE — Telephone Encounter (Signed)
I received an email from Melanie Brown letting me know that despite restarting her medications, she is continuing to have shortness of breath and coughing. Given the long duration of symptoms, I am going to send in a prednisone burst. She is also interested restarting Berna Bue, which she last got around six months ago or maybe longer. It was costing her around $200 per injection for this once she was on Medicare, so she decided to forgo this.   I am going to schedule a televisit with her this morning to discuss this more.   Salvatore Marvel, MD Allergy [facility]

## 2019-07-06 ENCOUNTER — Telehealth: Payer: Self-pay | Admitting: Allergy & Immunology

## 2019-07-06 NOTE — Telephone Encounter (Signed)
We received her spirometry from the outside facility.  Her numbers are about 200 mL less than they were when we got her Spiriva in January of this year.  I would like to get her back on the [facility] and then repeat the spirometry again before she goes in for her surgery at the end of the year.  I emailed patient and she is fine with this plan.  We also received her outside labs.  We got a complete blood count with differential and her absolute eosinophil count is back to 2215.  I think she would definitely benefit from getting Fasenra back on board.  Salvatore Marvel, MD [facility] may magically happen

## 2019-11-13 ENCOUNTER — Encounter: Payer: Self-pay | Admitting: Allergy & Immunology

## 2019-11-13 ENCOUNTER — Ambulatory Visit (INDEPENDENT_AMBULATORY_CARE_PROVIDER_SITE_OTHER): Payer: Medicare Other | Admitting: Allergy & Immunology

## 2019-11-13 ENCOUNTER — Other Ambulatory Visit: Payer: Self-pay

## 2019-11-13 DIAGNOSIS — J3089 Other allergic rhinitis: Secondary | ICD-10-CM | POA: Diagnosis not present

## 2019-11-13 DIAGNOSIS — J45909 Unspecified asthma, uncomplicated: Secondary | ICD-10-CM | POA: Diagnosis not present

## 2019-11-13 DIAGNOSIS — J339 Nasal polyp, unspecified: Secondary | ICD-10-CM | POA: Diagnosis not present

## 2019-11-13 DIAGNOSIS — J4550 Severe persistent asthma, uncomplicated: Secondary | ICD-10-CM

## 2019-11-13 DIAGNOSIS — Z886 Allergy status to analgesic agent status: Secondary | ICD-10-CM

## 2019-11-13 MED ORDER — CETIRIZINE HCL 10 MG PO TABS: 10.0000 mg | ORAL_TABLET | Freq: Every day | ORAL | 5 refills | Status: AC

## 2019-11-13 MED ORDER — ADVAIR HFA 115-21 MCG/ACT IN AERO: INHALATION_SPRAY | RESPIRATORY_TRACT | 1 refills | Status: DC

## 2019-11-13 NOTE — Patient Instructions (Addendum)
1. Chronic allergic rhinitis - Continue with cetirizine 10mg  daily. - Continue with Xhance but decrease to two sprays per nostril daily instead of two sprays per nostril twice daily.   2. Severe persistent asthma, uncomplicated with coexisting aspirin exacerbated respiratory disease (Samter's Triad) - We are going to decrease your Advair to one puff twice daily.  - Daily controller medication(s): Advair 230/21 one puff twice daily WITH SPACER - Rescue medications: ProAir 4 puffs every 4-6 hours as needed or albuterol nebulizer one vial puffs every 4-6 hours as needed - Changes during respiratory infections or worsening symptoms: add Spiriva two puffs once daily - Asthma control goals:  * Full participation in all desired activities (may need albuterol before activity) * Albuterol use two time or less a week on average (not counting use with activity) * Cough interfering with sleep two time or less a month * Oral steroids no more than once a year * No hospitalizations  3. Return in about 6 months (around 05/12/2020). This can be an in-person, a virtual Webex or a telephone follow up visit.   Please inform us of any Emergency Department visits, hospitalizations, or changes in symptoms. Call us before going to the ED for breathing or allergy symptoms since we might be able to fit you in for a sick visit. Feel free to contact us anytime with any questions, problems, or concerns.  It was a pleasure to talk to you today today!  Websites that have reliable patient information: 1. American Academy of Asthma, Allergy, and Immunology: www.aaaai.org 2. Food Allergy Research and Education (FARE): foodallergy.org 3. Mothers of Asthmatics: http://www.asthmacommunitynetwork.org 4. American College of Allergy, Asthma, and Immunology: www.acaai.org  "Like" [place] on Facebook and Instagram for our latest updates!      Make sure you are registered to vote! If you have moved or changed any of your contact  information, you will need to get this updated before voting!  In some cases, you MAY be able to register to vote online: [email or web address]

## 2019-11-13 NOTE — Progress Notes (Signed)
RE: Melanie Brown MRN: [id number] DOB: 29-Oct-1951 Date of Telemedicine Visit: 11/13/2019  Referring provider: [name], MD Primary care provider: [name], MD  Chief Complaint: Asthma   Telemedicine Follow Up Visit via Telephone: I connected with Melanie Brown for a follow up on 11/13/19 by telephone and verified that I am speaking with the correct person using two identifiers.   I discussed the limitations, risks, security and privacy concerns of performing an evaluation and management service by telephone and the availability of in person appointments. I also discussed with the patient that there may be a patient responsible charge related to this service. The patient expressed understanding and agreed to proceed.  Patient is at home.  Provider is at the office.  Visit start time: 5:17 PM Visit end time: 5:40 PM Insurance consent/check in by: [facility] Medical consent and medical assistant/nurse: Garlon Hatchet  History of Present Illness:  She is a 68 y.o. female, who is being followed for AERD. Her previous allergy office visit was in July 2020 with myself. At her last visit, she was not doing well and required a prednisone burst. We continued her on Advair 230/21 two puffs BID in conjunction with Fasenra every 8 weeks. We were looking into changing her to [place] since we felt that this was more affordable for her. For her allergic rhinitis, we continued with cetirizine 10mg  daily as well as Xhance two sprays per nostril twice daily. In the interim, she did transition from [place] to [place] one injection every two weeks.   Since the last visit, she has done fairly well. She does report that she woke up on Thursday with chills and ache and GI issues. Overall it is getting better. She has had no lung problems with this. She did not get tested for COVID and denies any known exposures at all.   Asthma/Respiratory Symptom History: She is on the Advair 230/21 two puffs twice daily. She is  open to decreasing. She has transitioned to [place] on September 9th. She is approved for free drug through December 2020 and she has submitted paperwork for 2021. She does feel that it works even better than Berna Bue was doing. ACT is 25 indicating excellent asthma control.   Allergic Rhinitis Symptom History: She does report some clear red tinged blood speckles in her mucous. This has been going on since her viral illness started on Thursday. She has had no fever with this. She is doing the [place] now as well, two sprays per nostril twice daily. She has not tried decreasing the dosing at all at home. She denies any worsening congestion and has has not been able to breathe so well through her nasal cavity in quite some time. She has not needed antibiotics at all since the last visit.   She continues to work towards her goal of having gastric bypass. It is going to take place either December 2020 or early 2021. Recovery would be 2-4 weeks. She needs a letter of stability for her surgeon - Dr. Pietro Brown with [facility], [place].   Otherwise, there have been no changes to her past medical history, surgical history, family history, or social history.  Assessment and Plan:  [name] is a 68 y.o. female with:  Severe persistent asthma without complication  History of nasal polyposis  Perennial allergic rhinitis(molds)  Aspirin intolerance  Samter's triad   1. Chronic allergic rhinitis - Continue with cetirizine 10mg  daily. - Continue with Xhance but decrease to two sprays per  nostril daily instead of two sprays per nostril twice daily.   2. Severe persistent asthma, uncomplicated with coexisting aspirin exacerbated respiratory disease (Samter's Triad) - We are going to decrease your Advair to one puff twice daily.  - Daily controller medication(s): Advair 230/21 one puff twice daily WITH SPACER - Rescue medications: ProAir 4 puffs every 4-6 hours as needed or  albuterol nebulizer one vial puffs every 4-6 hours as needed - Changes during respiratory infections or worsening symptoms: add Spiriva two puffs once daily - Asthma control goals:  * Full participation in all desired activities (may need albuterol before activity) * Albuterol use two time or less a week on average (not counting use with activity) * Cough interfering with sleep two time or less a month * Oral steroids no more than once a year * No hospitalizations  3. Return in about 6 months (around 05/12/2020). This can be an in-person, a virtual Webex or a telephone follow up visit.    Diagnostics: None.  Medication List:  Current Outpatient Medications  Medication Sig Dispense Refill  . albuterol (VENTOLIN HFA) 108 (90 Base) MCG/ACT inhaler Inhale 2 puffs into the lungs every 4 (four) hours as needed for wheezing or shortness of breath. 18 g 0  . Carboxymeth-Glyc-Polysorb PF (REFRESH OPTIVE MEGA-3) 0.5-1-0.5 % SOLN Place 1 drop into both eyes 3 (three) times daily as needed (RED EYES/IRRITATION.).    Marland Kitchen cetirizine (ZYRTEC) 10 MG tablet Take 1 tablet (10 mg total) by mouth daily. 30 tablet 5  . Cholecalciferol (VITAMIN D3) 250 MCG (10000 UT) TABS Take 10,000 Units by mouth daily.    . clotrimazole-betamethasone (LOTRISONE) cream Apply 1 application topically 2 (two) times daily as needed (skin irritation).   4  . dicyclomine (BENTYL) 10 MG capsule Take 1 capsule (10 mg total) by mouth 4 (four) times daily as needed (diarrhea/IBS). 360 capsule 3  . dupilumab (DUPIXENT) 200 MG/1.14ML prefilled syringe Dupixent 200 mg/1.14 mL subcutaneous syringe  Inject 1.14 mL every 2 weeks by subcutaneous route.    . Fluticasone Propionate (XHANCE) 93 MCG/ACT EXHU Place 2 sprays into the nose 2 (two) times a day. 32 mL 11  . fluticasone-salmeterol (ADVAIR HFA) 115-21 MCG/ACT inhaler TAKE 2 PUFFS BY MOUTH TWICE A DAY 3 Inhaler 1  . ipratropium-albuterol (DUONEB) 0.5-2.5 (3) MG/3ML SOLN Take 3 mLs by  nebulization every 4 (four) hours as needed for up to 30 days (CONGESTION, COUGH). 90 mL 0  . Multiple Vitamin (MULTIVITAMIN WITH MINERALS) TABS tablet Take 1 tablet by mouth daily. Centrum Silver    . omeprazole (PRILOSEC) 40 MG capsule Take 1 capsule (40 mg total) by mouth daily. 90 capsule 1  . Respiratory Therapy Supplies (NEBULIZER) DEVI Use As Directed 1 each 0  . SUMAtriptan (IMITREX) 100 MG tablet Take 100 mg by mouth every 2 (two) hours as needed for migraine.   6  . Tiotropium Bromide Monohydrate (SPIRIVA RESPIMAT) 1.25 MCG/ACT AERS Inhale 2 puffs into the lungs daily. (Patient not taking: Reported on 11/13/2019) 12 g 1   No current facility-administered medications for this visit.    Allergies: Allergies  Allergen Reactions  . Aspirin Shortness Of Breath and Rash  . Chloramphenicols Shortness Of Breath    Felt "whoozy"  . Chloropyramine Hcl   . Procaine Hcl Other (See Comments)     muscle spasm of neck  . Penicillins Rash    Did it involve swelling of the face/tongue/throat, SOB, or low BP? Yes Did it involve sudden or  severe rash/hives, skin peeling, or any reaction on the inside of your mouth or nose? Yes Did you need to seek medical attention at a hospital or doctor's office? No When did it last happen?more than 30 years If all above answers are "NO", may proceed with cephalosporin use.    I reviewed her past medical history, social history, family history, and environmental history and no significant changes have been reported from previous visits.  Review of Systems  Constitutional: Negative for activity change, appetite change, chills, fatigue and fever.  HENT: Negative for congestion, postnasal drip, rhinorrhea, sinus pressure and sore throat.   Eyes: Negative for pain, discharge, redness and itching.  Respiratory: Negative for shortness of breath, wheezing and stridor.   Gastrointestinal: Negative for diarrhea, nausea and vomiting.  Endocrine: Negative for  cold intolerance and heat intolerance.  Musculoskeletal: Negative for arthralgias, joint swelling and myalgias.  Skin: Negative for rash.  Allergic/Immunologic: Negative for environmental allergies, food allergies and immunocompromised state.    Objective:  Physical exam not obtained as encounter was done via telephone.   Previous notes and tests were reviewed.  I discussed the assessment and treatment plan with the patient. The patient was provided an opportunity to ask questions and all were answered. The patient agreed with the plan and demonstrated an understanding of the instructions.   The patient was advised to call back or seek an in-person evaluation if the symptoms worsen or if the condition fails to improve as anticipated.  I provided 23 minutes of non-face-to-face time during this encounter.  It was my pleasure to participate in [facility] care today. Please feel free to contact me with any questions or concerns.   Sincerely,  Valentina Shaggy, MD

## 2019-11-27 ENCOUNTER — Ambulatory Visit: Payer: Self-pay | Admitting: Allergy & Immunology

## 2020-01-28 DIAGNOSIS — R710 Precipitous drop in hematocrit: Secondary | ICD-10-CM | POA: Diagnosis not present

## 2020-01-28 DIAGNOSIS — R030 Elevated blood-pressure reading, without diagnosis of hypertension: Secondary | ICD-10-CM | POA: Diagnosis not present

## 2020-01-28 DIAGNOSIS — K589 Irritable bowel syndrome without diarrhea: Secondary | ICD-10-CM | POA: Diagnosis not present

## 2020-01-28 DIAGNOSIS — M797 Fibromyalgia: Secondary | ICD-10-CM | POA: Diagnosis not present

## 2020-01-28 DIAGNOSIS — Z6841 Body Mass Index (BMI) 40.0 and over, adult: Secondary | ICD-10-CM | POA: Diagnosis not present

## 2020-01-28 DIAGNOSIS — Z01810 Encounter for preprocedural cardiovascular examination: Secondary | ICD-10-CM | POA: Diagnosis not present

## 2020-01-28 DIAGNOSIS — J4550 Severe persistent asthma, uncomplicated: Secondary | ICD-10-CM | POA: Diagnosis not present

## 2020-01-28 DIAGNOSIS — K219 Gastro-esophageal reflux disease without esophagitis: Secondary | ICD-10-CM | POA: Diagnosis not present

## 2020-01-28 DIAGNOSIS — Z20822 Contact with and (suspected) exposure to covid-19: Secondary | ICD-10-CM | POA: Diagnosis not present

## 2020-01-28 DIAGNOSIS — J45909 Unspecified asthma, uncomplicated: Secondary | ICD-10-CM | POA: Diagnosis not present

## 2020-03-07 ENCOUNTER — Ambulatory Visit (INDEPENDENT_AMBULATORY_CARE_PROVIDER_SITE_OTHER): Payer: Medicare PPO | Admitting: Allergy & Immunology

## 2020-03-07 ENCOUNTER — Other Ambulatory Visit: Payer: Self-pay

## 2020-03-07 DIAGNOSIS — J4550 Severe persistent asthma, uncomplicated: Secondary | ICD-10-CM

## 2020-03-07 DIAGNOSIS — J45909 Unspecified asthma, uncomplicated: Secondary | ICD-10-CM

## 2020-03-07 DIAGNOSIS — J339 Nasal polyp, unspecified: Secondary | ICD-10-CM

## 2020-03-07 DIAGNOSIS — Z886 Allergy status to analgesic agent status: Secondary | ICD-10-CM | POA: Diagnosis not present

## 2020-03-07 DIAGNOSIS — J3089 Other allergic rhinitis: Secondary | ICD-10-CM | POA: Diagnosis not present

## 2020-03-07 DIAGNOSIS — Z8709 Personal history of other diseases of the respiratory system: Secondary | ICD-10-CM | POA: Diagnosis not present

## 2020-03-07 NOTE — Progress Notes (Signed)
RE: SAMOYA HOCHMAN MRN: [id number] DOB: Jun 23, 1951 Date of Telemedicine Visit: 03/07/2020  Referring provider: [name], MD Primary care provider: [name], MD  Chief Complaint: Medication Management   Telemedicine Follow Up Visit via Telephone: I connected with Tronda Relford for a follow up on 03/10/20 by telephone and verified that I am speaking with the correct person using two identifiers.   I discussed the limitations, risks, security and privacy concerns of performing an evaluation and management service by telephone and the availability of in person appointments. I also discussed with the patient that there may be a patient responsible charge related to this service. The patient expressed understanding and agreed to proceed.  Patient is at home.  Provider is at the office.  Visit start time: 11:03 AM Visit end time: 11:16 AM Insurance consent/check in by: [facility] Medical consent and medical assistant/nurse: Olivia Mackie  History of Present Illness:  She is a 69 y.o. female, who is being followed for Samter's triad. Her previous allergy office visit was in November 2020 with myself.  At that time, we continued her on Advair 230/21 mcg 1 puff twice daily with spacer as well as albuterol as needed.  She does have Spiriva but she has during respiratory flares.  She had transitioned from [place] to [place] since she could do that at home.  For her rhinitis, she continued with cetirizine and XHANCE.  Since last visit, she had her bariatric surgery in February. She is doing much better post op. Then she had colonic hemorrhaging. It did clear up in five days without any new surgeries. She did have some additional pain but after that initial week, she had a normal recovery and is getting better.  Her blood pressure is back to normal.  She has lost quite a bit of weight.  Asthma/Respiratory Symptom History: Her breathing has been better for several months. Currently she finished her last  syringe of Dupixent.  She contacted me this morning because they were planning to send autoinjectors instead of the syringes.  She prefers the syringes since she has been doing well on those without any issues.  They have not shipped the auto-injectors. She has been on Dupixent since September. It has made a huge difference with her asthma and her nasal polyps. The last time that she got systemic steroids was July 2020.  She remains on Advair 230/21 2 puffs once daily.  Allergic Rhinitis Symptom History: She is using her Xhance. We are supplying her witht samples to get her through May 2021. She is coming to [place] on May 28th. The last time that she needed antibiotics was years ago.   She has not gotten her vaccination.  She is planning to do that when she becomes more stable from a postop perspective.  She does have migraines, but she has not had 1 in over a year since starting the [facility].  Otherwise, there have been no changes to her past medical history, surgical history, family history, or social history.  Assessment and Plan:  Major is a 69 y.o. female with:  Severe persistent asthma without complication  History of nasal polyposis  Perennial allergic rhinitis(molds)  Aspirin intolerance  Samter's triad   We are going to continue with the current round of medications.  We did make an appointment so that she could stop by and get a spirometry as well say hello to everybody.  I talked to [name], our Systems analyst, and she is going to reach out to [employer] [facility] to see why they are sending autoinjectors instead of the syringes.  I am optimistic we can get that change before any treatment.  We will continue with the Advair 2 puffs once daily as well as the [facility] and cetirizine.  This has been a very effective combination of medications for her as evidenced by the fact that she has not had antibiotics in well over a year and has not needed prednisone during that same time.   She is good to be visiting [place] in around 6 weeks so that she can get a spirometry and say hello to everybody.  Diagnostics: None.  Medication List:  Current Outpatient Medications  Medication Sig Dispense Refill  . albuterol (VENTOLIN HFA) 108 (90 Base) MCG/ACT inhaler Inhale 2 puffs into the lungs every 4 (four) hours as needed for wheezing or shortness of breath. 18 g 0  . Carboxymeth-Glyc-Polysorb PF (REFRESH OPTIVE MEGA-3) 0.5-1-0.5 % SOLN Place 1 drop into both eyes 3 (three) times daily as needed (RED EYES/IRRITATION.).    Marland Kitchen cetirizine (ZYRTEC) 10 MG tablet Take 1 tablet (10 mg total) by mouth daily. 30 tablet 5  . clotrimazole-betamethasone (LOTRISONE) cream Apply 1 application topically 2 (two) times daily as needed (skin irritation).   4  . dupilumab (DUPIXENT) 200 MG/1.14ML prefilled syringe Dupixent 200 mg/1.14 mL subcutaneous syringe  Inject 1.14 mL every 2 weeks by subcutaneous route.    . Fluticasone Propionate (XHANCE) 93 MCG/ACT EXHU Place 2 sprays into the nose 2 (two) times a day. 32 mL 11  . fluticasone-salmeterol (ADVAIR HFA) 115-21 MCG/ACT inhaler TAKE 2 PUFFS BY MOUTH TWICE A DAY 3 Inhaler 1  . ipratropium-albuterol (DUONEB) 0.5-2.5 (3) MG/3ML SOLN Take 3 mLs by nebulization every 4 (four) hours as needed for up to 30 days (CONGESTION, COUGH). 90 mL 0  . Multiple Vitamin (MULTIVITAMIN WITH MINERALS) TABS tablet Take 1 tablet by mouth daily. Centrum Silver    . Respiratory Therapy Supplies (NEBULIZER) DEVI Use As Directed 1 each 0  . SUMAtriptan (IMITREX) 100 MG tablet Take 100 mg by mouth every 2 (two) hours as needed for migraine.   6   No current facility-administered medications for this visit.   Allergies: Allergies  Allergen Reactions  . Aspirin Shortness Of Breath and Rash  . Chloramphenicols Shortness Of Breath    Felt "whoozy"  . Chloropyramine Hcl   . Procaine Hcl Other (See Comments)     muscle spasm of neck  . Penicillins Rash    Did it  involve swelling of the face/tongue/throat, SOB, or low BP? Yes Did it involve sudden or severe rash/hives, skin peeling, or any reaction on the inside of your mouth or nose? Yes Did you need to seek medical attention at a hospital or doctor's office? No When did it last happen?more than 30 years If all above answers are "NO", may proceed with cephalosporin use.    I reviewed her past medical history, social history, family history, and environmental history and no significant changes have been reported from previous visits.  Review of Systems  Constitutional: Negative for activity change, appetite change, chills, diaphoresis and fatigue.  HENT: Negative for congestion, postnasal drip, rhinorrhea, sinus pressure and sore throat.   Eyes: Negative for pain, discharge, redness and itching.  Respiratory: Negative for shortness of breath, wheezing and stridor.   Gastrointestinal: Negative for diarrhea, nausea and vomiting.  Musculoskeletal: Negative for arthralgias, joint swelling and myalgias.  Skin: Negative for rash.  Allergic/Immunologic: Negative for environmental allergies  and food allergies.    Objective:  Physical exam not obtained as encounter was done via telephone.   Previous notes and tests were reviewed.  I discussed the assessment and treatment plan with the patient. The patient was provided an opportunity to ask questions and all were answered. The patient agreed with the plan and demonstrated an understanding of the instructions.   The patient was advised to call back or seek an in-person evaluation if the symptoms worsen or if the condition fails to improve as anticipated.  I provided 13 minutes of non-face-to-face time during this encounter.  It was my pleasure to participate in [facility] care today. Please feel free to contact me with any questions or concerns.   Sincerely,  Valentina Shaggy, MD

## 2020-03-10 ENCOUNTER — Encounter: Payer: Self-pay | Admitting: Allergy & Immunology

## 2020-03-11 DIAGNOSIS — K912 Postsurgical malabsorption, not elsewhere classified: Secondary | ICD-10-CM | POA: Diagnosis not present

## 2020-03-11 DIAGNOSIS — D520 Dietary folate deficiency anemia: Secondary | ICD-10-CM | POA: Diagnosis not present

## 2020-03-11 DIAGNOSIS — E559 Vitamin D deficiency, unspecified: Secondary | ICD-10-CM | POA: Diagnosis not present

## 2020-03-25 DIAGNOSIS — H109 Unspecified conjunctivitis: Secondary | ICD-10-CM | POA: Diagnosis not present

## 2020-04-02 ENCOUNTER — Encounter: Payer: Self-pay | Admitting: Gastroenterology

## 2020-04-09 DIAGNOSIS — B009 Herpesviral infection, unspecified: Secondary | ICD-10-CM | POA: Diagnosis not present

## 2020-04-11 DIAGNOSIS — B009 Herpesviral infection, unspecified: Secondary | ICD-10-CM | POA: Diagnosis not present

## 2020-04-22 DIAGNOSIS — M797 Fibromyalgia: Secondary | ICD-10-CM | POA: Diagnosis not present

## 2020-04-22 DIAGNOSIS — G43909 Migraine, unspecified, not intractable, without status migrainosus: Secondary | ICD-10-CM | POA: Diagnosis not present

## 2020-04-22 DIAGNOSIS — J4542 Moderate persistent asthma with status asthmaticus: Secondary | ICD-10-CM | POA: Diagnosis not present

## 2020-04-22 DIAGNOSIS — Z79899 Other long term (current) drug therapy: Secondary | ICD-10-CM | POA: Diagnosis not present

## 2020-05-16 ENCOUNTER — Encounter: Payer: Self-pay | Admitting: Allergy & Immunology

## 2020-05-16 ENCOUNTER — Ambulatory Visit: Payer: Medicare PPO | Admitting: Allergy & Immunology

## 2020-05-16 ENCOUNTER — Other Ambulatory Visit: Payer: Self-pay

## 2020-05-16 VITALS — BP 102/76 | HR 74 | Resp 18 | Ht 62.0 in

## 2020-05-16 DIAGNOSIS — J3089 Other allergic rhinitis: Secondary | ICD-10-CM

## 2020-05-16 DIAGNOSIS — Z8709 Personal history of other diseases of the respiratory system: Secondary | ICD-10-CM | POA: Diagnosis not present

## 2020-05-16 DIAGNOSIS — Z886 Allergy status to analgesic agent status: Secondary | ICD-10-CM | POA: Diagnosis not present

## 2020-05-16 DIAGNOSIS — J339 Nasal polyp, unspecified: Secondary | ICD-10-CM

## 2020-05-16 DIAGNOSIS — J4550 Severe persistent asthma, uncomplicated: Secondary | ICD-10-CM

## 2020-05-16 DIAGNOSIS — J45909 Unspecified asthma, uncomplicated: Secondary | ICD-10-CM | POA: Diagnosis not present

## 2020-05-16 NOTE — Progress Notes (Signed)
FOLLOW UP  Date of Service/Encounter:  05/17/20   Assessment:   Severe persistent asthma without complication  History of nasal polyposis  Perennial allergic rhinitis(molds)  Aspirin intolerance  Samter's triad   S/p gastric bypass (February 2021)  COVID19 vaccine hesitancy   Plan/Recommendations:   1. Chronic allergic rhinitis - Continue with cetirizine 10mg  daily. - Continue with Xhance one spray per nostril daily.  - Sample of Xhance provided.  - Verbal consent to release office note for authorization or appeal provided, if needed.   2. Severe persistent asthma, uncomplicated with coexisting aspirin exacerbated respiratory disease (Samter's Triad) - We are going to decrease your Advair to one puff twice daily.  - Daily controller medication(s): Advair 230/21 two puffs once daily WITH SPACER + Dupixent every two weeks - Rescue medications: ProAir 4 puffs every 4-6 hours as needed or albuterol nebulizer one vial puffs every 4-6 hours as needed - Changes during respiratory infections or worsening symptoms: increased Advair to two puffs twice daily for 1-2 weeks - Asthma control goals:  * Full participation in all desired activities (may need albuterol before activity) * Albuterol use two time or less a week on average (not counting use with activity) * Cough interfering with sleep two time or less a month * Oral steroids no more than once a year * No hospitalizations  3. Return in about 6 months (around 11/16/2020). This can be an in-person, a virtual Webex or a telephone follow up visit.   Subjective:   Melanie Brown is a 69 y.o. female presenting today for follow up of  Chief Complaint  Patient presents with  . Asthma    Melanie Brown has a history of the following: Patient Active Problem List   Diagnosis Date Noted  . IBS (irritable bowel syndrome) 05/03/2019  . Obesity, morbid ([place]) 02/06/2019  . Personal history of colonic polyps 11/02/2018   . Need for hepatitis C screening test 12/30/2017  . Chronic diarrhea 11/15/2017  . Severe persistent asthma without complication [id number]  . Samter's triad 11/09/2016  . Chronic nonseasonal allergic rhinitis due to fungal spores 11/09/2016  . Aspirin intolerance 11/09/2016  . History of nasal polyposis 11/09/2016  . Dysphagia   . Change in bowel habits 11/20/2015  . Rectal bleeding 11/20/2015  . GERD (gastroesophageal reflux disease) 11/20/2015  . Loose stools 11/20/2015  . Cervical radiculopathy 12/28/2013  . Headache, migraine 12/03/2013    History obtained from: chart review and patient.  Melanie Brown is a 69 y.o. female presenting for a follow up visit.  She was last seen in March 2021.  This was via a telephone visit, [place]  However, she is in town visiting family, so she came in for an in person appointment.  At the last visit, she was complaining that she was receiving Dupixent autoinjectors instead of the syringes.  She preferred the syringes and was wondering if we could stop the autoinjectors.  We continue with her Advair 2 puffs once daily as well as XHANCE and cetirizine.  This combination was working well, so well in fact that she had not had antibiotics in over 1 year.  Since the last visit, she has done well. She actually underwent a gastric bypass in February 2021 and tolerated this procedure well aside from some prolonged passage of blood. She is doing much better now and has lost more than 60 pounds.   Asthma/Respiratory Symptom History: She remains on her Advair 230/21 two puffs  BID. She is also on the [place] which is working well for her control of her atopic disease. She does not remember the last time that she needed systemic steroids for her symptoms. Melanie Brown's asthma has been well controlled. She has not required rescue medication, experienced nocturnal awakenings due to lower respiratory symptoms, nor have activities of daily living been limited.  She has required no Emergency Department or Urgent Care visits for her asthma. She has required zero courses of systemic steroids for asthma exacerbations since the last visit. ACT score today is 25, indicating excellent asthma symptom control.   Allergic Rhinitis Symptom History: She remains on her [place] Perna and tries to limit to to one spray per nostril daily iin order to make the supply last longer. She has not needed any antibiotics at all for her symptoms. She remains on her antihistamine daily.    She has not received her COVID19 vaccination. She has some questions about it today.   Otherwise, there have been no changes to her past medical history, surgical history, family history, or social history.    Review of Systems  Constitutional: Negative.  Negative for chills, fever, malaise/fatigue and weight loss.  HENT: Negative for congestion, ear discharge, ear pain and sinus pain.   Eyes: Negative for pain, discharge and redness.  Respiratory: Negative for cough, sputum production, shortness of breath and wheezing.   Cardiovascular: Negative.  Negative for chest pain and palpitations.  Gastrointestinal: Negative for abdominal pain, constipation, diarrhea, heartburn, nausea and vomiting.  Skin: Negative.  Negative for itching and rash.  Neurological: Negative for dizziness and headaches.  Endo/Heme/Allergies: Positive for environmental allergies. Does not bruise/bleed easily.       Objective:   Blood pressure 102/76, pulse 74, resp. rate 18, height 5\' 2"  (1.575 m), SpO2 95 %. Body mass index is 48.1 kg/m.   Physical Exam:  Physical Exam  Constitutional: She appears well-developed.  Clearly has lost weight. She is smiling and interactive.   HENT:  Head: Normocephalic and atraumatic.  Right Ear: Tympanic membrane, external ear and ear canal normal.  Left Ear: Tympanic membrane, external ear and ear canal normal.  Nose: Mucosal edema and rhinorrhea present. No nasal deformity or  septal deviation. No epistaxis. Right sinus exhibits no maxillary sinus tenderness and no frontal sinus tenderness. Left sinus exhibits no maxillary sinus tenderness and no frontal sinus tenderness.  Mouth/Throat: Uvula is midline and oropharynx is clear and moist. Mucous membranes are not pale and not dry.  Cobblestoning present in the posterior oropharynx.   Eyes: Pupils are equal, round, and reactive to light. Conjunctivae and EOM are normal. Right eye exhibits no chemosis and no discharge. Left eye exhibits no chemosis and no discharge. Right conjunctiva is not injected. Left conjunctiva is not injected.  Cardiovascular: Normal rate, regular rhythm and normal heart sounds.  Respiratory: Effort normal and breath sounds normal. No accessory muscle usage. No tachypnea. No respiratory distress. She has no wheezes. She has no rhonchi. She has no rales. She exhibits no tenderness.  Moving air well in all lung fields. No increased work of breathing noted.   Lymphadenopathy:    She has no cervical adenopathy.  Neurological: She is alert.  Skin: No abrasion, no petechiae and no rash noted. Rash is not papular, not vesicular and not urticarial. No erythema. No pallor.  No eczematous or urticarial lesions noted.   Psychiatric: She has a normal mood and affect.     Diagnostic studies:    Spirometry:  Salvatore Marvel, MD  [facility] [place]

## 2020-05-16 NOTE — Patient Instructions (Addendum)
1. Chronic allergic rhinitis - Continue with cetirizine 10mg  daily. - Continue with Xhance one spray per nostril daily.  - Sample of Xhance provided.   2. Severe persistent asthma, uncomplicated with coexisting aspirin exacerbated respiratory disease (Samter's Triad) - We are going to decrease your Advair to one puff twice daily.  - Daily controller medication(s): Advair 230/21 two puffs once daily WITH SPACER + Dupixent every two weeks - Rescue medications: ProAir 4 puffs every 4-6 hours as needed or albuterol nebulizer one vial puffs every 4-6 hours as needed - Changes during respiratory infections or worsening symptoms: increased Advair to two puffs twice daily for 1-2 weeks - Asthma control goals:  * Full participation in all desired activities (may need albuterol before activity) * Albuterol use two time or less a week on average (not counting use with activity) * Cough interfering with sleep two time or less a month * Oral steroids no more than once a year * No hospitalizations  3. Return in about 6 months (around 11/16/2020). This can be an in-person, a virtual Webex or a telephone follow up visit.   Please inform us of any Emergency Department visits, hospitalizations, or changes in symptoms. Call us before going to the ED for breathing or allergy symptoms since we might be able to fit you in for a sick visit. Feel free to contact us anytime with any questions, problems, or concerns.  It was a pleasure to see you again today!  Websites that have reliable patient information: 1. American Academy of Asthma, Allergy, and Immunology: www.aaaai.org 2. Food Allergy Research and Education (FARE): foodallergy.org 3. Mothers of Asthmatics: http://www.asthmacommunitynetwork.org 4. American College of Allergy, Asthma, and Immunology: www.acaai.org   COVID-19 Vaccine Information can be found at: ShippingScam.[email or web address].uk For questions  related to vaccine distribution or appointments, please email [email or web address] or call [phone].     "Like" [place] on Facebook and Instagram for our latest updates!       HAPPY SPRING!  Make sure you are registered to vote! If you have moved or changed any of your contact information, you will need to get this updated before voting!  In some cases, you MAY be able to register to vote online: [email or web address]

## 2020-05-18 ENCOUNTER — Encounter: Payer: Self-pay | Admitting: Allergy & Immunology

## 2020-05-21 DIAGNOSIS — H04123 Dry eye syndrome of bilateral lacrimal glands: Secondary | ICD-10-CM | POA: Diagnosis not present

## 2020-05-21 DIAGNOSIS — H25813 Combined forms of age-related cataract, bilateral: Secondary | ICD-10-CM | POA: Diagnosis not present

## 2020-05-21 DIAGNOSIS — H25811 Combined forms of age-related cataract, right eye: Secondary | ICD-10-CM | POA: Diagnosis not present

## 2020-07-22 DIAGNOSIS — E559 Vitamin D deficiency, unspecified: Secondary | ICD-10-CM | POA: Diagnosis not present

## 2020-07-22 DIAGNOSIS — K912 Postsurgical malabsorption, not elsewhere classified: Secondary | ICD-10-CM | POA: Diagnosis not present

## 2020-07-22 DIAGNOSIS — D520 Dietary folate deficiency anemia: Secondary | ICD-10-CM | POA: Diagnosis not present

## 2020-08-08 DIAGNOSIS — Z20822 Contact with and (suspected) exposure to covid-19: Secondary | ICD-10-CM | POA: Diagnosis not present

## 2020-08-08 DIAGNOSIS — Z01812 Encounter for preprocedural laboratory examination: Secondary | ICD-10-CM | POA: Diagnosis not present

## 2020-08-11 DIAGNOSIS — K449 Diaphragmatic hernia without obstruction or gangrene: Secondary | ICD-10-CM | POA: Diagnosis not present

## 2020-08-11 DIAGNOSIS — Z7951 Long term (current) use of inhaled steroids: Secondary | ICD-10-CM | POA: Diagnosis not present

## 2020-08-11 DIAGNOSIS — K287 Chronic gastrojejunal ulcer without hemorrhage or perforation: Secondary | ICD-10-CM | POA: Diagnosis not present

## 2020-08-11 DIAGNOSIS — J45909 Unspecified asthma, uncomplicated: Secondary | ICD-10-CM | POA: Diagnosis not present

## 2020-08-11 DIAGNOSIS — R1310 Dysphagia, unspecified: Secondary | ICD-10-CM | POA: Diagnosis not present

## 2020-08-11 DIAGNOSIS — D509 Iron deficiency anemia, unspecified: Secondary | ICD-10-CM | POA: Diagnosis not present

## 2020-08-11 DIAGNOSIS — Z79899 Other long term (current) drug therapy: Secondary | ICD-10-CM | POA: Diagnosis not present

## 2020-08-11 DIAGNOSIS — Z9884 Bariatric surgery status: Secondary | ICD-10-CM | POA: Diagnosis not present

## 2020-08-11 DIAGNOSIS — K219 Gastro-esophageal reflux disease without esophagitis: Secondary | ICD-10-CM | POA: Diagnosis not present

## 2020-10-21 DIAGNOSIS — D520 Dietary folate deficiency anemia: Secondary | ICD-10-CM | POA: Diagnosis not present

## 2020-10-21 DIAGNOSIS — E559 Vitamin D deficiency, unspecified: Secondary | ICD-10-CM | POA: Diagnosis not present

## 2020-10-21 DIAGNOSIS — Z9884 Bariatric surgery status: Secondary | ICD-10-CM | POA: Diagnosis not present

## 2020-10-21 DIAGNOSIS — K912 Postsurgical malabsorption, not elsewhere classified: Secondary | ICD-10-CM | POA: Diagnosis not present

## 2020-11-24 ENCOUNTER — Telehealth: Payer: Self-pay | Admitting: *Deleted

## 2020-11-24 NOTE — Telephone Encounter (Signed)
Tried to reach patient to inquire if she had received [facility] re-enrollment for Dupixent my way.  They advised have not received same from her.  Also patient due for MD appt

## 2020-12-08 NOTE — Telephone Encounter (Signed)
Spoke to patient and she will send in this week

## 2020-12-08 NOTE — Telephone Encounter (Signed)
L/M for patient to contact me to advise if she had done [facility] re-enrollment for Dupixent

## 2020-12-09 DIAGNOSIS — K912 Postsurgical malabsorption, not elsewhere classified: Secondary | ICD-10-CM | POA: Diagnosis not present

## 2020-12-09 DIAGNOSIS — K911 Postgastric surgery syndromes: Secondary | ICD-10-CM | POA: Diagnosis not present

## 2020-12-09 DIAGNOSIS — D508 Other iron deficiency anemias: Secondary | ICD-10-CM | POA: Diagnosis not present

## 2020-12-23 ENCOUNTER — Other Ambulatory Visit: Payer: Self-pay | Admitting: *Deleted

## 2020-12-23 MED ORDER — DUPIXENT 300 MG/2ML ~~LOC~~ SOAJ: 300.0000 mg | SUBCUTANEOUS | 11 refills | Status: DC

## 2021-01-05 DIAGNOSIS — K912 Postsurgical malabsorption, not elsewhere classified: Secondary | ICD-10-CM | POA: Diagnosis not present

## 2021-01-05 DIAGNOSIS — K911 Postgastric surgery syndromes: Secondary | ICD-10-CM | POA: Diagnosis not present

## 2021-01-06 DIAGNOSIS — D508 Other iron deficiency anemias: Secondary | ICD-10-CM | POA: Diagnosis not present

## 2021-01-06 DIAGNOSIS — K911 Postgastric surgery syndromes: Secondary | ICD-10-CM | POA: Diagnosis not present

## 2021-01-06 DIAGNOSIS — K912 Postsurgical malabsorption, not elsewhere classified: Secondary | ICD-10-CM | POA: Diagnosis not present

## 2021-01-12 DIAGNOSIS — M9903 Segmental and somatic dysfunction of lumbar region: Secondary | ICD-10-CM | POA: Diagnosis not present

## 2021-01-12 DIAGNOSIS — M9902 Segmental and somatic dysfunction of thoracic region: Secondary | ICD-10-CM | POA: Diagnosis not present

## 2021-01-12 DIAGNOSIS — M5136 Other intervertebral disc degeneration, lumbar region: Secondary | ICD-10-CM | POA: Diagnosis not present

## 2021-01-12 DIAGNOSIS — M9901 Segmental and somatic dysfunction of cervical region: Secondary | ICD-10-CM | POA: Diagnosis not present

## 2021-01-14 DIAGNOSIS — M9903 Segmental and somatic dysfunction of lumbar region: Secondary | ICD-10-CM | POA: Diagnosis not present

## 2021-01-14 DIAGNOSIS — M5136 Other intervertebral disc degeneration, lumbar region: Secondary | ICD-10-CM | POA: Diagnosis not present

## 2021-01-14 DIAGNOSIS — M9901 Segmental and somatic dysfunction of cervical region: Secondary | ICD-10-CM | POA: Diagnosis not present

## 2021-01-14 DIAGNOSIS — M9902 Segmental and somatic dysfunction of thoracic region: Secondary | ICD-10-CM | POA: Diagnosis not present

## 2021-01-19 DIAGNOSIS — E559 Vitamin D deficiency, unspecified: Secondary | ICD-10-CM | POA: Diagnosis not present

## 2021-01-19 DIAGNOSIS — K912 Postsurgical malabsorption, not elsewhere classified: Secondary | ICD-10-CM | POA: Diagnosis not present

## 2021-01-19 DIAGNOSIS — Z9884 Bariatric surgery status: Secondary | ICD-10-CM | POA: Diagnosis not present

## 2021-01-20 DIAGNOSIS — Z9884 Bariatric surgery status: Secondary | ICD-10-CM | POA: Diagnosis not present

## 2021-01-21 DIAGNOSIS — M9902 Segmental and somatic dysfunction of thoracic region: Secondary | ICD-10-CM | POA: Diagnosis not present

## 2021-01-21 DIAGNOSIS — M5136 Other intervertebral disc degeneration, lumbar region: Secondary | ICD-10-CM | POA: Diagnosis not present

## 2021-01-21 DIAGNOSIS — M9903 Segmental and somatic dysfunction of lumbar region: Secondary | ICD-10-CM | POA: Diagnosis not present

## 2021-01-21 DIAGNOSIS — M9901 Segmental and somatic dysfunction of cervical region: Secondary | ICD-10-CM | POA: Diagnosis not present

## 2021-01-22 DIAGNOSIS — R1012 Left upper quadrant pain: Secondary | ICD-10-CM | POA: Diagnosis not present

## 2021-01-22 DIAGNOSIS — R188 Other ascites: Secondary | ICD-10-CM | POA: Diagnosis not present

## 2021-01-22 DIAGNOSIS — Z9884 Bariatric surgery status: Secondary | ICD-10-CM | POA: Diagnosis not present

## 2021-01-22 DIAGNOSIS — K6389 Other specified diseases of intestine: Secondary | ICD-10-CM | POA: Diagnosis not present

## 2021-01-22 DIAGNOSIS — K5730 Diverticulosis of large intestine without perforation or abscess without bleeding: Secondary | ICD-10-CM | POA: Diagnosis not present

## 2021-02-02 DIAGNOSIS — Z9884 Bariatric surgery status: Secondary | ICD-10-CM | POA: Diagnosis not present

## 2021-02-02 DIAGNOSIS — E559 Vitamin D deficiency, unspecified: Secondary | ICD-10-CM | POA: Diagnosis not present

## 2021-02-02 DIAGNOSIS — K912 Postsurgical malabsorption, not elsewhere classified: Secondary | ICD-10-CM | POA: Diagnosis not present

## 2021-02-04 DIAGNOSIS — M9902 Segmental and somatic dysfunction of thoracic region: Secondary | ICD-10-CM | POA: Diagnosis not present

## 2021-02-04 DIAGNOSIS — M9903 Segmental and somatic dysfunction of lumbar region: Secondary | ICD-10-CM | POA: Diagnosis not present

## 2021-02-04 DIAGNOSIS — M9901 Segmental and somatic dysfunction of cervical region: Secondary | ICD-10-CM | POA: Diagnosis not present

## 2021-02-04 DIAGNOSIS — M5136 Other intervertebral disc degeneration, lumbar region: Secondary | ICD-10-CM | POA: Diagnosis not present

## 2021-02-09 DIAGNOSIS — M9901 Segmental and somatic dysfunction of cervical region: Secondary | ICD-10-CM | POA: Diagnosis not present

## 2021-02-09 DIAGNOSIS — M9902 Segmental and somatic dysfunction of thoracic region: Secondary | ICD-10-CM | POA: Diagnosis not present

## 2021-02-09 DIAGNOSIS — M9903 Segmental and somatic dysfunction of lumbar region: Secondary | ICD-10-CM | POA: Diagnosis not present

## 2021-02-09 DIAGNOSIS — M5136 Other intervertebral disc degeneration, lumbar region: Secondary | ICD-10-CM | POA: Diagnosis not present

## 2021-02-25 DIAGNOSIS — M9901 Segmental and somatic dysfunction of cervical region: Secondary | ICD-10-CM | POA: Diagnosis not present

## 2021-02-25 DIAGNOSIS — M9903 Segmental and somatic dysfunction of lumbar region: Secondary | ICD-10-CM | POA: Diagnosis not present

## 2021-02-25 DIAGNOSIS — M9902 Segmental and somatic dysfunction of thoracic region: Secondary | ICD-10-CM | POA: Diagnosis not present

## 2021-02-25 DIAGNOSIS — M5136 Other intervertebral disc degeneration, lumbar region: Secondary | ICD-10-CM | POA: Diagnosis not present

## 2021-03-04 DIAGNOSIS — M9902 Segmental and somatic dysfunction of thoracic region: Secondary | ICD-10-CM | POA: Diagnosis not present

## 2021-03-04 DIAGNOSIS — M9901 Segmental and somatic dysfunction of cervical region: Secondary | ICD-10-CM | POA: Diagnosis not present

## 2021-03-04 DIAGNOSIS — M5136 Other intervertebral disc degeneration, lumbar region: Secondary | ICD-10-CM | POA: Diagnosis not present

## 2021-03-04 DIAGNOSIS — M9903 Segmental and somatic dysfunction of lumbar region: Secondary | ICD-10-CM | POA: Diagnosis not present

## 2021-03-09 ENCOUNTER — Other Ambulatory Visit: Payer: Self-pay | Admitting: *Deleted

## 2021-03-09 DIAGNOSIS — M9903 Segmental and somatic dysfunction of lumbar region: Secondary | ICD-10-CM | POA: Diagnosis not present

## 2021-03-09 DIAGNOSIS — M9901 Segmental and somatic dysfunction of cervical region: Secondary | ICD-10-CM | POA: Diagnosis not present

## 2021-03-09 DIAGNOSIS — M5136 Other intervertebral disc degeneration, lumbar region: Secondary | ICD-10-CM | POA: Diagnosis not present

## 2021-03-09 DIAGNOSIS — M9902 Segmental and somatic dysfunction of thoracic region: Secondary | ICD-10-CM | POA: Diagnosis not present

## 2021-03-09 MED ORDER — DUPIXENT 300 MG/2ML ~~LOC~~ SOAJ: 300.0000 mg | SUBCUTANEOUS | 11 refills | Status: DC

## 2021-03-16 DIAGNOSIS — M9903 Segmental and somatic dysfunction of lumbar region: Secondary | ICD-10-CM | POA: Diagnosis not present

## 2021-03-16 DIAGNOSIS — M9901 Segmental and somatic dysfunction of cervical region: Secondary | ICD-10-CM | POA: Diagnosis not present

## 2021-03-16 DIAGNOSIS — M9902 Segmental and somatic dysfunction of thoracic region: Secondary | ICD-10-CM | POA: Diagnosis not present

## 2021-03-16 DIAGNOSIS — M5136 Other intervertebral disc degeneration, lumbar region: Secondary | ICD-10-CM | POA: Diagnosis not present

## 2021-03-23 DIAGNOSIS — M5136 Other intervertebral disc degeneration, lumbar region: Secondary | ICD-10-CM | POA: Diagnosis not present

## 2021-03-23 DIAGNOSIS — M9903 Segmental and somatic dysfunction of lumbar region: Secondary | ICD-10-CM | POA: Diagnosis not present

## 2021-03-23 DIAGNOSIS — M9902 Segmental and somatic dysfunction of thoracic region: Secondary | ICD-10-CM | POA: Diagnosis not present

## 2021-03-23 DIAGNOSIS — M9901 Segmental and somatic dysfunction of cervical region: Secondary | ICD-10-CM | POA: Diagnosis not present

## 2021-03-30 DIAGNOSIS — M9903 Segmental and somatic dysfunction of lumbar region: Secondary | ICD-10-CM | POA: Diagnosis not present

## 2021-03-30 DIAGNOSIS — M9902 Segmental and somatic dysfunction of thoracic region: Secondary | ICD-10-CM | POA: Diagnosis not present

## 2021-03-30 DIAGNOSIS — M5136 Other intervertebral disc degeneration, lumbar region: Secondary | ICD-10-CM | POA: Diagnosis not present

## 2021-03-30 DIAGNOSIS — M9901 Segmental and somatic dysfunction of cervical region: Secondary | ICD-10-CM | POA: Diagnosis not present

## 2021-04-05 DIAGNOSIS — Z20822 Contact with and (suspected) exposure to covid-19: Secondary | ICD-10-CM | POA: Diagnosis not present

## 2021-04-05 DIAGNOSIS — Z01812 Encounter for preprocedural laboratory examination: Secondary | ICD-10-CM | POA: Diagnosis not present

## 2021-04-06 DIAGNOSIS — D508 Other iron deficiency anemias: Secondary | ICD-10-CM | POA: Diagnosis not present

## 2021-04-08 DIAGNOSIS — Z9884 Bariatric surgery status: Secondary | ICD-10-CM | POA: Diagnosis not present

## 2021-04-08 DIAGNOSIS — R12 Heartburn: Secondary | ICD-10-CM | POA: Diagnosis not present

## 2021-04-08 DIAGNOSIS — K219 Gastro-esophageal reflux disease without esophagitis: Secondary | ICD-10-CM | POA: Diagnosis not present

## 2021-04-09 DIAGNOSIS — K912 Postsurgical malabsorption, not elsewhere classified: Secondary | ICD-10-CM | POA: Diagnosis not present

## 2021-04-09 DIAGNOSIS — D508 Other iron deficiency anemias: Secondary | ICD-10-CM | POA: Diagnosis not present

## 2021-04-09 DIAGNOSIS — K911 Postgastric surgery syndromes: Secondary | ICD-10-CM | POA: Diagnosis not present

## 2021-04-13 DIAGNOSIS — M5136 Other intervertebral disc degeneration, lumbar region: Secondary | ICD-10-CM | POA: Diagnosis not present

## 2021-04-13 DIAGNOSIS — M9902 Segmental and somatic dysfunction of thoracic region: Secondary | ICD-10-CM | POA: Diagnosis not present

## 2021-04-13 DIAGNOSIS — M9901 Segmental and somatic dysfunction of cervical region: Secondary | ICD-10-CM | POA: Diagnosis not present

## 2021-04-13 DIAGNOSIS — M9903 Segmental and somatic dysfunction of lumbar region: Secondary | ICD-10-CM | POA: Diagnosis not present

## 2021-04-27 DIAGNOSIS — M9903 Segmental and somatic dysfunction of lumbar region: Secondary | ICD-10-CM | POA: Diagnosis not present

## 2021-04-27 DIAGNOSIS — M9902 Segmental and somatic dysfunction of thoracic region: Secondary | ICD-10-CM | POA: Diagnosis not present

## 2021-04-27 DIAGNOSIS — M5136 Other intervertebral disc degeneration, lumbar region: Secondary | ICD-10-CM | POA: Diagnosis not present

## 2021-04-27 DIAGNOSIS — M9901 Segmental and somatic dysfunction of cervical region: Secondary | ICD-10-CM | POA: Diagnosis not present

## 2021-05-25 DIAGNOSIS — M9903 Segmental and somatic dysfunction of lumbar region: Secondary | ICD-10-CM | POA: Diagnosis not present

## 2021-05-25 DIAGNOSIS — M9902 Segmental and somatic dysfunction of thoracic region: Secondary | ICD-10-CM | POA: Diagnosis not present

## 2021-05-25 DIAGNOSIS — M9901 Segmental and somatic dysfunction of cervical region: Secondary | ICD-10-CM | POA: Diagnosis not present

## 2021-05-25 DIAGNOSIS — M5136 Other intervertebral disc degeneration, lumbar region: Secondary | ICD-10-CM | POA: Diagnosis not present

## 2021-06-08 DIAGNOSIS — M9903 Segmental and somatic dysfunction of lumbar region: Secondary | ICD-10-CM | POA: Diagnosis not present

## 2021-06-08 DIAGNOSIS — M5136 Other intervertebral disc degeneration, lumbar region: Secondary | ICD-10-CM | POA: Diagnosis not present

## 2021-06-08 DIAGNOSIS — M9902 Segmental and somatic dysfunction of thoracic region: Secondary | ICD-10-CM | POA: Diagnosis not present

## 2021-06-08 DIAGNOSIS — M9901 Segmental and somatic dysfunction of cervical region: Secondary | ICD-10-CM | POA: Diagnosis not present

## 2021-07-20 DIAGNOSIS — M9901 Segmental and somatic dysfunction of cervical region: Secondary | ICD-10-CM | POA: Diagnosis not present

## 2021-07-20 DIAGNOSIS — M9902 Segmental and somatic dysfunction of thoracic region: Secondary | ICD-10-CM | POA: Diagnosis not present

## 2021-07-20 DIAGNOSIS — M9903 Segmental and somatic dysfunction of lumbar region: Secondary | ICD-10-CM | POA: Diagnosis not present

## 2021-07-20 DIAGNOSIS — M5136 Other intervertebral disc degeneration, lumbar region: Secondary | ICD-10-CM | POA: Diagnosis not present

## 2021-08-10 DIAGNOSIS — M5136 Other intervertebral disc degeneration, lumbar region: Secondary | ICD-10-CM | POA: Diagnosis not present

## 2021-08-10 DIAGNOSIS — M9902 Segmental and somatic dysfunction of thoracic region: Secondary | ICD-10-CM | POA: Diagnosis not present

## 2021-08-10 DIAGNOSIS — M9901 Segmental and somatic dysfunction of cervical region: Secondary | ICD-10-CM | POA: Diagnosis not present

## 2021-08-10 DIAGNOSIS — M9903 Segmental and somatic dysfunction of lumbar region: Secondary | ICD-10-CM | POA: Diagnosis not present

## 2021-08-17 DIAGNOSIS — M9901 Segmental and somatic dysfunction of cervical region: Secondary | ICD-10-CM | POA: Diagnosis not present

## 2021-08-17 DIAGNOSIS — M5136 Other intervertebral disc degeneration, lumbar region: Secondary | ICD-10-CM | POA: Diagnosis not present

## 2021-08-17 DIAGNOSIS — M9902 Segmental and somatic dysfunction of thoracic region: Secondary | ICD-10-CM | POA: Diagnosis not present

## 2021-08-17 DIAGNOSIS — M9903 Segmental and somatic dysfunction of lumbar region: Secondary | ICD-10-CM | POA: Diagnosis not present

## 2021-09-07 DIAGNOSIS — M9902 Segmental and somatic dysfunction of thoracic region: Secondary | ICD-10-CM | POA: Diagnosis not present

## 2021-09-07 DIAGNOSIS — M5136 Other intervertebral disc degeneration, lumbar region: Secondary | ICD-10-CM | POA: Diagnosis not present

## 2021-09-07 DIAGNOSIS — M9901 Segmental and somatic dysfunction of cervical region: Secondary | ICD-10-CM | POA: Diagnosis not present

## 2021-09-07 DIAGNOSIS — M9903 Segmental and somatic dysfunction of lumbar region: Secondary | ICD-10-CM | POA: Diagnosis not present

## 2021-10-05 DIAGNOSIS — M5136 Other intervertebral disc degeneration, lumbar region: Secondary | ICD-10-CM | POA: Diagnosis not present

## 2021-10-05 DIAGNOSIS — M9903 Segmental and somatic dysfunction of lumbar region: Secondary | ICD-10-CM | POA: Diagnosis not present

## 2021-10-05 DIAGNOSIS — M9901 Segmental and somatic dysfunction of cervical region: Secondary | ICD-10-CM | POA: Diagnosis not present

## 2021-10-05 DIAGNOSIS — M9902 Segmental and somatic dysfunction of thoracic region: Secondary | ICD-10-CM | POA: Diagnosis not present

## 2021-10-19 DIAGNOSIS — M9902 Segmental and somatic dysfunction of thoracic region: Secondary | ICD-10-CM | POA: Diagnosis not present

## 2021-10-19 DIAGNOSIS — M5136 Other intervertebral disc degeneration, lumbar region: Secondary | ICD-10-CM | POA: Diagnosis not present

## 2021-10-19 DIAGNOSIS — M9903 Segmental and somatic dysfunction of lumbar region: Secondary | ICD-10-CM | POA: Diagnosis not present

## 2021-10-19 DIAGNOSIS — M9901 Segmental and somatic dysfunction of cervical region: Secondary | ICD-10-CM | POA: Diagnosis not present

## 2021-11-02 DIAGNOSIS — H25813 Combined forms of age-related cataract, bilateral: Secondary | ICD-10-CM | POA: Diagnosis not present

## 2021-11-09 DIAGNOSIS — M9902 Segmental and somatic dysfunction of thoracic region: Secondary | ICD-10-CM | POA: Diagnosis not present

## 2021-11-09 DIAGNOSIS — M5136 Other intervertebral disc degeneration, lumbar region: Secondary | ICD-10-CM | POA: Diagnosis not present

## 2021-11-09 DIAGNOSIS — M9901 Segmental and somatic dysfunction of cervical region: Secondary | ICD-10-CM | POA: Diagnosis not present

## 2021-11-09 DIAGNOSIS — M9903 Segmental and somatic dysfunction of lumbar region: Secondary | ICD-10-CM | POA: Diagnosis not present

## 2021-12-18 DIAGNOSIS — H35373 Puckering of macula, bilateral: Secondary | ICD-10-CM | POA: Diagnosis not present

## 2021-12-18 DIAGNOSIS — H25813 Combined forms of age-related cataract, bilateral: Secondary | ICD-10-CM | POA: Diagnosis not present

## 2021-12-18 DIAGNOSIS — H25811 Combined forms of age-related cataract, right eye: Secondary | ICD-10-CM | POA: Diagnosis not present

## 2021-12-28 DIAGNOSIS — M9903 Segmental and somatic dysfunction of lumbar region: Secondary | ICD-10-CM | POA: Diagnosis not present

## 2021-12-28 DIAGNOSIS — M9901 Segmental and somatic dysfunction of cervical region: Secondary | ICD-10-CM | POA: Diagnosis not present

## 2021-12-28 DIAGNOSIS — M5136 Other intervertebral disc degeneration, lumbar region: Secondary | ICD-10-CM | POA: Diagnosis not present

## 2021-12-28 DIAGNOSIS — M9902 Segmental and somatic dysfunction of thoracic region: Secondary | ICD-10-CM | POA: Diagnosis not present

## 2022-01-19 DIAGNOSIS — H25811 Combined forms of age-related cataract, right eye: Secondary | ICD-10-CM | POA: Diagnosis not present

## 2022-01-25 DIAGNOSIS — M5136 Other intervertebral disc degeneration, lumbar region: Secondary | ICD-10-CM | POA: Diagnosis not present

## 2022-01-25 DIAGNOSIS — M9902 Segmental and somatic dysfunction of thoracic region: Secondary | ICD-10-CM | POA: Diagnosis not present

## 2022-01-25 DIAGNOSIS — M9901 Segmental and somatic dysfunction of cervical region: Secondary | ICD-10-CM | POA: Diagnosis not present

## 2022-01-25 DIAGNOSIS — M9903 Segmental and somatic dysfunction of lumbar region: Secondary | ICD-10-CM | POA: Diagnosis not present

## 2022-02-01 ENCOUNTER — Encounter: Payer: Self-pay | Admitting: *Deleted

## 2022-02-01 DIAGNOSIS — Z9884 Bariatric surgery status: Secondary | ICD-10-CM | POA: Diagnosis not present

## 2022-02-01 DIAGNOSIS — R970 Elevated carcinoembryonic antigen [CEA]: Secondary | ICD-10-CM | POA: Diagnosis not present

## 2022-02-02 DIAGNOSIS — J4520 Mild intermittent asthma, uncomplicated: Secondary | ICD-10-CM | POA: Diagnosis not present

## 2022-02-02 DIAGNOSIS — R030 Elevated blood-pressure reading, without diagnosis of hypertension: Secondary | ICD-10-CM | POA: Diagnosis not present

## 2022-02-02 DIAGNOSIS — D508 Other iron deficiency anemias: Secondary | ICD-10-CM | POA: Diagnosis not present

## 2022-02-02 DIAGNOSIS — Z13220 Encounter for screening for lipoid disorders: Secondary | ICD-10-CM | POA: Diagnosis not present

## 2022-02-02 DIAGNOSIS — Z136 Encounter for screening for cardiovascular disorders: Secondary | ICD-10-CM | POA: Diagnosis not present

## 2022-02-05 DIAGNOSIS — H25812 Combined forms of age-related cataract, left eye: Secondary | ICD-10-CM | POA: Diagnosis not present

## 2022-02-09 DIAGNOSIS — H25812 Combined forms of age-related cataract, left eye: Secondary | ICD-10-CM | POA: Diagnosis not present

## 2022-02-16 DIAGNOSIS — H25812 Combined forms of age-related cataract, left eye: Secondary | ICD-10-CM | POA: Diagnosis not present

## 2022-02-17 DIAGNOSIS — Z800 Family history of malignant neoplasm of digestive organs: Secondary | ICD-10-CM | POA: Diagnosis not present

## 2022-02-17 DIAGNOSIS — Z9884 Bariatric surgery status: Secondary | ICD-10-CM | POA: Diagnosis not present

## 2022-02-17 DIAGNOSIS — Z1211 Encounter for screening for malignant neoplasm of colon: Secondary | ICD-10-CM | POA: Diagnosis not present

## 2022-02-17 DIAGNOSIS — R970 Elevated carcinoembryonic antigen [CEA]: Secondary | ICD-10-CM | POA: Diagnosis not present

## 2022-02-17 DIAGNOSIS — K5730 Diverticulosis of large intestine without perforation or abscess without bleeding: Secondary | ICD-10-CM | POA: Diagnosis not present

## 2022-02-17 DIAGNOSIS — K219 Gastro-esophageal reflux disease without esophagitis: Secondary | ICD-10-CM | POA: Diagnosis not present

## 2022-02-17 DIAGNOSIS — K449 Diaphragmatic hernia without obstruction or gangrene: Secondary | ICD-10-CM | POA: Diagnosis not present

## 2022-03-01 DIAGNOSIS — K5730 Diverticulosis of large intestine without perforation or abscess without bleeding: Secondary | ICD-10-CM | POA: Diagnosis not present

## 2022-03-01 DIAGNOSIS — R970 Elevated carcinoembryonic antigen [CEA]: Secondary | ICD-10-CM | POA: Diagnosis not present

## 2022-03-01 DIAGNOSIS — Z9884 Bariatric surgery status: Secondary | ICD-10-CM | POA: Diagnosis not present

## 2022-03-01 DIAGNOSIS — R109 Unspecified abdominal pain: Secondary | ICD-10-CM | POA: Diagnosis not present

## 2022-03-08 DIAGNOSIS — M5136 Other intervertebral disc degeneration, lumbar region: Secondary | ICD-10-CM | POA: Diagnosis not present

## 2022-03-08 DIAGNOSIS — M9903 Segmental and somatic dysfunction of lumbar region: Secondary | ICD-10-CM | POA: Diagnosis not present

## 2022-03-08 DIAGNOSIS — M9901 Segmental and somatic dysfunction of cervical region: Secondary | ICD-10-CM | POA: Diagnosis not present

## 2022-03-08 DIAGNOSIS — M9902 Segmental and somatic dysfunction of thoracic region: Secondary | ICD-10-CM | POA: Diagnosis not present

## 2022-03-22 DIAGNOSIS — M9903 Segmental and somatic dysfunction of lumbar region: Secondary | ICD-10-CM | POA: Diagnosis not present

## 2022-03-22 DIAGNOSIS — M9901 Segmental and somatic dysfunction of cervical region: Secondary | ICD-10-CM | POA: Diagnosis not present

## 2022-03-22 DIAGNOSIS — M9902 Segmental and somatic dysfunction of thoracic region: Secondary | ICD-10-CM | POA: Diagnosis not present

## 2022-03-22 DIAGNOSIS — M5136 Other intervertebral disc degeneration, lumbar region: Secondary | ICD-10-CM | POA: Diagnosis not present

## 2022-04-05 DIAGNOSIS — M9903 Segmental and somatic dysfunction of lumbar region: Secondary | ICD-10-CM | POA: Diagnosis not present

## 2022-04-05 DIAGNOSIS — M9901 Segmental and somatic dysfunction of cervical region: Secondary | ICD-10-CM | POA: Diagnosis not present

## 2022-04-05 DIAGNOSIS — M5136 Other intervertebral disc degeneration, lumbar region: Secondary | ICD-10-CM | POA: Diagnosis not present

## 2022-04-05 DIAGNOSIS — M9902 Segmental and somatic dysfunction of thoracic region: Secondary | ICD-10-CM | POA: Diagnosis not present

## 2022-04-06 DIAGNOSIS — R970 Elevated carcinoembryonic antigen [CEA]: Secondary | ICD-10-CM | POA: Diagnosis not present

## 2022-04-06 DIAGNOSIS — Z9884 Bariatric surgery status: Secondary | ICD-10-CM | POA: Diagnosis not present

## 2022-05-03 DIAGNOSIS — M5136 Other intervertebral disc degeneration, lumbar region: Secondary | ICD-10-CM | POA: Diagnosis not present

## 2022-05-03 DIAGNOSIS — M9902 Segmental and somatic dysfunction of thoracic region: Secondary | ICD-10-CM | POA: Diagnosis not present

## 2022-05-03 DIAGNOSIS — M9901 Segmental and somatic dysfunction of cervical region: Secondary | ICD-10-CM | POA: Diagnosis not present

## 2022-05-03 DIAGNOSIS — M9903 Segmental and somatic dysfunction of lumbar region: Secondary | ICD-10-CM | POA: Diagnosis not present

## 2022-05-10 DIAGNOSIS — R109 Unspecified abdominal pain: Secondary | ICD-10-CM | POA: Diagnosis not present

## 2022-05-10 DIAGNOSIS — Z9884 Bariatric surgery status: Secondary | ICD-10-CM | POA: Diagnosis not present

## 2022-05-10 DIAGNOSIS — R970 Elevated carcinoembryonic antigen [CEA]: Secondary | ICD-10-CM | POA: Diagnosis not present

## 2022-05-27 DIAGNOSIS — K912 Postsurgical malabsorption, not elsewhere classified: Secondary | ICD-10-CM | POA: Diagnosis not present

## 2022-05-27 DIAGNOSIS — K911 Postgastric surgery syndromes: Secondary | ICD-10-CM | POA: Diagnosis not present

## 2022-05-27 DIAGNOSIS — D508 Other iron deficiency anemias: Secondary | ICD-10-CM | POA: Diagnosis not present

## 2022-05-27 DIAGNOSIS — R970 Elevated carcinoembryonic antigen [CEA]: Secondary | ICD-10-CM | POA: Diagnosis not present

## 2022-06-14 DIAGNOSIS — M9903 Segmental and somatic dysfunction of lumbar region: Secondary | ICD-10-CM | POA: Diagnosis not present

## 2022-06-14 DIAGNOSIS — M5136 Other intervertebral disc degeneration, lumbar region: Secondary | ICD-10-CM | POA: Diagnosis not present

## 2022-06-14 DIAGNOSIS — M9901 Segmental and somatic dysfunction of cervical region: Secondary | ICD-10-CM | POA: Diagnosis not present

## 2022-06-14 DIAGNOSIS — M9902 Segmental and somatic dysfunction of thoracic region: Secondary | ICD-10-CM | POA: Diagnosis not present

## 2022-06-17 DIAGNOSIS — R970 Elevated carcinoembryonic antigen [CEA]: Secondary | ICD-10-CM | POA: Diagnosis not present

## 2022-06-17 DIAGNOSIS — K911 Postgastric surgery syndromes: Secondary | ICD-10-CM | POA: Diagnosis not present

## 2022-07-05 DIAGNOSIS — M9903 Segmental and somatic dysfunction of lumbar region: Secondary | ICD-10-CM | POA: Diagnosis not present

## 2022-07-05 DIAGNOSIS — M9901 Segmental and somatic dysfunction of cervical region: Secondary | ICD-10-CM | POA: Diagnosis not present

## 2022-07-05 DIAGNOSIS — M9902 Segmental and somatic dysfunction of thoracic region: Secondary | ICD-10-CM | POA: Diagnosis not present

## 2022-07-05 DIAGNOSIS — M5136 Other intervertebral disc degeneration, lumbar region: Secondary | ICD-10-CM | POA: Diagnosis not present

## 2022-07-15 DIAGNOSIS — H04123 Dry eye syndrome of bilateral lacrimal glands: Secondary | ICD-10-CM | POA: Diagnosis not present

## 2022-08-09 DIAGNOSIS — M9903 Segmental and somatic dysfunction of lumbar region: Secondary | ICD-10-CM | POA: Diagnosis not present

## 2022-08-09 DIAGNOSIS — M5136 Other intervertebral disc degeneration, lumbar region: Secondary | ICD-10-CM | POA: Diagnosis not present

## 2022-08-09 DIAGNOSIS — M9902 Segmental and somatic dysfunction of thoracic region: Secondary | ICD-10-CM | POA: Diagnosis not present

## 2022-08-09 DIAGNOSIS — M9901 Segmental and somatic dysfunction of cervical region: Secondary | ICD-10-CM | POA: Diagnosis not present

## 2022-08-12 DIAGNOSIS — R970 Elevated carcinoembryonic antigen [CEA]: Secondary | ICD-10-CM | POA: Diagnosis not present

## 2022-08-19 DIAGNOSIS — K911 Postgastric surgery syndromes: Secondary | ICD-10-CM | POA: Diagnosis not present

## 2022-08-19 DIAGNOSIS — D508 Other iron deficiency anemias: Secondary | ICD-10-CM | POA: Diagnosis not present

## 2022-08-19 DIAGNOSIS — R970 Elevated carcinoembryonic antigen [CEA]: Secondary | ICD-10-CM | POA: Diagnosis not present

## 2022-08-19 DIAGNOSIS — K912 Postsurgical malabsorption, not elsewhere classified: Secondary | ICD-10-CM | POA: Diagnosis not present

## 2022-09-06 DIAGNOSIS — M9901 Segmental and somatic dysfunction of cervical region: Secondary | ICD-10-CM | POA: Diagnosis not present

## 2022-09-06 DIAGNOSIS — M9903 Segmental and somatic dysfunction of lumbar region: Secondary | ICD-10-CM | POA: Diagnosis not present

## 2022-09-06 DIAGNOSIS — M5136 Other intervertebral disc degeneration, lumbar region: Secondary | ICD-10-CM | POA: Diagnosis not present

## 2022-09-06 DIAGNOSIS — M9902 Segmental and somatic dysfunction of thoracic region: Secondary | ICD-10-CM | POA: Diagnosis not present

## 2022-09-20 DIAGNOSIS — M5136 Other intervertebral disc degeneration, lumbar region: Secondary | ICD-10-CM | POA: Diagnosis not present

## 2022-09-20 DIAGNOSIS — M9901 Segmental and somatic dysfunction of cervical region: Secondary | ICD-10-CM | POA: Diagnosis not present

## 2022-09-20 DIAGNOSIS — M9903 Segmental and somatic dysfunction of lumbar region: Secondary | ICD-10-CM | POA: Diagnosis not present

## 2022-09-20 DIAGNOSIS — M9902 Segmental and somatic dysfunction of thoracic region: Secondary | ICD-10-CM | POA: Diagnosis not present

## 2022-09-24 ENCOUNTER — Ambulatory Visit: Payer: Medicare PPO | Admitting: Allergy & Immunology

## 2022-09-24 ENCOUNTER — Encounter: Payer: Self-pay | Admitting: Allergy & Immunology

## 2022-09-24 VITALS — BP 142/70 | HR 82 | Temp 98.5°F | Resp 14 | Ht 63.0 in | Wt 147.2 lb

## 2022-09-24 DIAGNOSIS — Z886 Allergy status to analgesic agent status: Secondary | ICD-10-CM | POA: Diagnosis not present

## 2022-09-24 DIAGNOSIS — J339 Nasal polyp, unspecified: Secondary | ICD-10-CM

## 2022-09-24 DIAGNOSIS — J4550 Severe persistent asthma, uncomplicated: Secondary | ICD-10-CM

## 2022-09-24 DIAGNOSIS — J3089 Other allergic rhinitis: Secondary | ICD-10-CM

## 2022-09-24 DIAGNOSIS — J45909 Unspecified asthma, uncomplicated: Secondary | ICD-10-CM

## 2022-09-24 MED ORDER — ALBUTEROL SULFATE HFA 108 (90 BASE) MCG/ACT IN AERS: 2.0000 | INHALATION_SPRAY | RESPIRATORY_TRACT | 2 refills | Status: AC | PRN

## 2022-09-24 MED ORDER — DOXYCYCLINE HYCLATE 50 MG PO CAPS: 50.0000 mg | ORAL_CAPSULE | Freq: Two times a day (BID) | ORAL | 3 refills | Status: AC

## 2022-09-24 MED ORDER — METRONIDAZOLE 1 % EX GEL: Freq: Every day | CUTANEOUS | 3 refills | Status: AC

## 2022-09-24 MED ORDER — IPRATROPIUM-ALBUTEROL 0.5-2.5 (3) MG/3ML IN SOLN: 3.0000 mL | RESPIRATORY_TRACT | 1 refills | Status: AC | PRN

## 2022-09-24 NOTE — Patient Instructions (Addendum)
1. Chronic allergic rhinitis - Continue with cetirizine '10mg'$  daily. - You are doing AMAZING!!!   2. Severe persistent asthma, uncomplicated with coexisting aspirin exacerbated respiratory disease (Samter's Triad) - Lung testing looks awesome today.  - You are in a good space.  - Daily controller medication(s): Dupixent every two weeks - Rescue medications: albuterol 4 puffs every 4-6 hours as needed or albuterol nebulizer one vial puffs every 4-6 hours as needed - Changes during respiratory infections or worsening symptoms: ADD ON Advair to two puffs twice daily for 1-2 weeks - Asthma control goals:  * Full participation in all desired activities (may need albuterol before activity) * Albuterol use two time or less a week on average (not counting use with activity) * Cough interfering with sleep two time or less a month * Oral steroids no more than once a year * No hospitalizations  3. Acne rosacea  - I sent in metronidazole gel which is also used to treat rosacea.  - Try that if it is not too expensive. - I will also send in doxycycline to have on hand until you see the dermatologist.  4. Return in about 1 year (around 09/25/2023).    Please inform us of any Emergency Department visits, hospitalizations, or changes in symptoms. Call us before going to the ED for breathing or allergy symptoms since we might be able to fit you in for a sick visit. Feel free to contact us anytime with any questions, problems, or concerns.  It was a pleasure to see you again today!  Websites that have reliable patient information: 1. American Academy of Asthma, Allergy, and Immunology: www.aaaai.org 2. Food Allergy Research and Education (FARE): foodallergy.org 3. Mothers of Asthmatics: http://www.asthmacommunitynetwork.org 4. American College of Allergy, Asthma, and Immunology: www.acaai.org   COVID-19 Vaccine Information can be found at:  [email or web address].[email or web address].uk For questions related to vaccine distribution or appointments, please email [email or web address] or call [phone].     "Like" [place] on Facebook and Instagram for our latest updates!      Make sure you are registered to vote! If you have moved or changed any of your contact information, you will need to get this updated before voting!  In some cases, you MAY be able to register to vote online: [email or web address]

## 2022-09-24 NOTE — Progress Notes (Signed)
FOLLOW UP  Date of Service/Encounter:  09/24/22   Assessment:   Severe persistent asthma without complication   Nasal polyposis with Samter's triad   Perennial allergic rhinitis (molds)  Acne rosacea    S/p gastric bypass (February 2021) - with 175 pound weight loss   COVID19 vaccine hesitancy   Plan/Recommendations:   1. Chronic allergic rhinitis - Continue with cetirizine '10mg'$  daily. - You are doing AMAZING!!!   2. Severe persistent asthma, uncomplicated with coexisting aspirin exacerbated respiratory disease (Samter's Triad) - Lung testing looks awesome today.  - You are in a good space.  - Daily controller medication(s): Dupixent every two weeks - Rescue medications: albuterol 4 puffs every 4-6 hours as needed or albuterol nebulizer one vial puffs every 4-6 hours as needed - Changes during respiratory infections or worsening symptoms: ADD ON Advair to two puffs twice daily for 1-2 weeks - Asthma control goals:  * Full participation in all desired activities (may need albuterol before activity) * Albuterol use two time or less a week on average (not counting use with activity) * Cough interfering with sleep two time or less a month * Oral steroids no more than once a year * No hospitalizations  3. Acne rosacea  - I sent in metronidazole gel which is also used to treat rosacea.  - Try that if it is not too expensive. - I will also send in doxycycline to have on hand until you see the dermatologist.  4. Return in about 1 year (around 09/25/2023).   Subjective:   Melanie Brown is a 71 y.o. female presenting today for follow up of  Chief Complaint  Patient presents with   Asthma    Says she is well. Only has had issues with chest tightness at times.    Allergic Rhinitis     Says she has had some flare ups.     Melanie Brown has a history of the following: Patient Active Problem List   Diagnosis Date Noted   IBS (irritable bowel syndrome) 05/03/2019    Obesity, morbid ([place]) 02/06/2019   Personal history of colonic polyps 11/02/2018   Need for hepatitis C screening test 12/30/2017   Chronic diarrhea 11/15/2017   Severe persistent asthma without complication 99/35/7017   Samter's triad 11/09/2016   Chronic nonseasonal allergic rhinitis due to fungal spores 11/09/2016   Aspirin intolerance 11/09/2016   History of nasal polyposis 11/09/2016   Dysphagia    Change in bowel habits 11/20/2015   Rectal bleeding 11/20/2015   GERD (gastroesophageal reflux disease) 11/20/2015   Loose stools 11/20/2015   Cervical radiculopathy 12/28/2013   Headache, migraine 12/03/2013    History obtained from: chart review and patient.  Melanie Brown is a 71 y.o. female presenting for a follow up visit. She was last seen in May 2021. At that time, we continued with cetirizine daily as well as Xhance. We also decreased her Advair to two puffs once daily. We also continued with Dupixent every two weeks.   Since the last visit, she has done very well. She happened to be coming in for a women's retreat in [place] and wanted to stop by for her annual visit.   Asthma/Respiratory Symptom History: She is doing very well. She is only on Dupixent alone.  She does have the Advair and the albuterol to add on when needed, but she has not needed to use it in quite some time. Skai's asthma has been well controlled. She has not required rescue  medication, experienced nocturnal awakenings due to lower respiratory symptoms, nor have activities of daily living been limited. She has required no Emergency Department or Urgent Care visits for her asthma. She has required zero courses of systemic steroids for asthma exacerbations since the last visit. ACT score today is 25, indicating excellent asthma symptom control. She has not needed to go to the ED at all for her symptoms.   Allergic Rhinitis Symptom History: She is no longer on a nose spray. She is on the cetirizine daily. She does  have a runny nose and starts sneezing. She [place] and has some rhinorrhea from that.  She has not needed antibiotics for any sinus infections or AOM in years.   She had cataract surgery in the spring time. She had some irritation of her visual fields. Evidently she had been infected with some kind of microscopic insect that has caused rosacea. She was started on doxycycline for one month and this improved it quite a bit. She is going to see a Dermatologist, but she is wondering if I could prescribe some more doxycycline in the interim. She was on doxycycline, she noticed that her acne got better. She has been using some tea tree oil as well to help with this.   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 She was not intending to teach again, but she had so much energy after the gastric bypass that she decided to go back into the classroom. Her daughter teaches 5th grade at the same school.   She has grandchildren as well as one great-grandchild with another great-grandchild on the way. She is very happy with how she is doing.   Otherwise, there have been no changes to her past medical history, surgical history, family history, or social history.    Review of Systems  Constitutional: Negative.  Negative for chills, fever, malaise/fatigue and weight loss.  HENT:  Negative for congestion, ear discharge, ear pain and sinus pain.   Eyes:  Negative for pain, discharge and redness.  Respiratory:  Negative for cough, sputum production, shortness of breath and wheezing.   Cardiovascular: Negative.  Negative for chest pain and palpitations.  Gastrointestinal:  Negative for abdominal pain, constipation, diarrhea, heartburn, nausea and vomiting.  Skin: Negative.  Negative for itching and rash.  Neurological:  Negative for dizziness and headaches.  Endo/Heme/Allergies:  Positive for environmental allergies. Does not bruise/bleed  easily.       Objective:   Blood pressure (!) 142/70, pulse 82, temperature 98.5 F (36.9 C), temperature source Temporal, resp. rate 14, height '5\' 3"'$  (1.6 m), weight 147 lb 3.2 oz (66.8 kg), SpO2 97 %. Body mass index is 26.08 kg/m.  Weight loss 175    Physical Exam Vitals reviewed.  Constitutional:      Appearance: She is well-developed.     Comments: Very happy and delightful.   HENT:     Head: Normocephalic and atraumatic.     Right Ear: Tympanic membrane, ear canal and external ear normal.     Left Ear: Tympanic membrane, ear canal and external ear normal.     Nose: Mucosal edema and rhinorrhea present. No nasal deformity or septal deviation.     Right Sinus: No maxillary sinus tenderness or frontal sinus tenderness.     Left Sinus: No maxillary sinus tenderness or frontal sinus tenderness.     Mouth/Throat:     Mouth: Mucous  membranes are not pale and not dry.     Pharynx: Uvula midline.  Eyes:     General:        Right eye: No discharge.        Left eye: No discharge.     Conjunctiva/sclera: Conjunctivae normal.     Right eye: Right conjunctiva is not injected. No chemosis.    Left eye: Left conjunctiva is not injected. No chemosis.    Pupils: Pupils are equal, round, and reactive to light.  Cardiovascular:     Rate and Rhythm: Normal rate and regular rhythm.     Heart sounds: Normal heart sounds.  Pulmonary:     Effort: Pulmonary effort is normal. No tachypnea, accessory muscle usage or respiratory distress.     Breath sounds: Normal breath sounds. No wheezing, rhonchi or rales.  Chest:     Chest wall: No tenderness.  Lymphadenopathy:     Cervical: No cervical adenopathy.  Skin:    Coloration: Skin is not pale.     Findings: No abrasion, erythema, petechiae or rash. Rash is not papular, urticarial or vesicular.     Comments: No eczematous or urticarial lesions noted.   Neurological:     Mental Status: She is alert.      Diagnostic studies:     Spirometry: results normal (FEV1: 1.70/78%, FVC: 2.01/72%, FEV1/FVC: 85%).    Spirometry consistent with normal pattern.  Allergy Studies: none        Salvatore Marvel, MD  [facility] [place]

## 2022-10-18 DIAGNOSIS — M9903 Segmental and somatic dysfunction of lumbar region: Secondary | ICD-10-CM | POA: Diagnosis not present

## 2022-10-18 DIAGNOSIS — M9902 Segmental and somatic dysfunction of thoracic region: Secondary | ICD-10-CM | POA: Diagnosis not present

## 2022-10-18 DIAGNOSIS — M9901 Segmental and somatic dysfunction of cervical region: Secondary | ICD-10-CM | POA: Diagnosis not present

## 2022-10-18 DIAGNOSIS — M5136 Other intervertebral disc degeneration, lumbar region: Secondary | ICD-10-CM | POA: Diagnosis not present

## 2022-11-01 DIAGNOSIS — R970 Elevated carcinoembryonic antigen [CEA]: Secondary | ICD-10-CM | POA: Diagnosis not present

## 2022-11-04 DIAGNOSIS — K912 Postsurgical malabsorption, not elsewhere classified: Secondary | ICD-10-CM | POA: Diagnosis not present

## 2022-11-04 DIAGNOSIS — K911 Postgastric surgery syndromes: Secondary | ICD-10-CM | POA: Diagnosis not present

## 2022-11-04 DIAGNOSIS — R970 Elevated carcinoembryonic antigen [CEA]: Secondary | ICD-10-CM | POA: Diagnosis not present

## 2022-11-04 DIAGNOSIS — D508 Other iron deficiency anemias: Secondary | ICD-10-CM | POA: Diagnosis not present

## 2022-11-15 DIAGNOSIS — M5136 Other intervertebral disc degeneration, lumbar region: Secondary | ICD-10-CM | POA: Diagnosis not present

## 2022-11-15 DIAGNOSIS — M9902 Segmental and somatic dysfunction of thoracic region: Secondary | ICD-10-CM | POA: Diagnosis not present

## 2022-11-15 DIAGNOSIS — M9903 Segmental and somatic dysfunction of lumbar region: Secondary | ICD-10-CM | POA: Diagnosis not present

## 2022-11-15 DIAGNOSIS — M9901 Segmental and somatic dysfunction of cervical region: Secondary | ICD-10-CM | POA: Diagnosis not present

## 2022-11-22 DIAGNOSIS — R002 Palpitations: Secondary | ICD-10-CM | POA: Diagnosis not present

## 2022-11-22 DIAGNOSIS — M62838 Other muscle spasm: Secondary | ICD-10-CM | POA: Diagnosis not present

## 2022-11-27 DIAGNOSIS — H04123 Dry eye syndrome of bilateral lacrimal glands: Secondary | ICD-10-CM | POA: Diagnosis not present

## 2022-11-29 DIAGNOSIS — M5136 Other intervertebral disc degeneration, lumbar region: Secondary | ICD-10-CM | POA: Diagnosis not present

## 2022-11-29 DIAGNOSIS — M9901 Segmental and somatic dysfunction of cervical region: Secondary | ICD-10-CM | POA: Diagnosis not present

## 2022-11-29 DIAGNOSIS — M9903 Segmental and somatic dysfunction of lumbar region: Secondary | ICD-10-CM | POA: Diagnosis not present

## 2022-11-29 DIAGNOSIS — M9902 Segmental and somatic dysfunction of thoracic region: Secondary | ICD-10-CM | POA: Diagnosis not present

## 2022-12-27 DIAGNOSIS — R002 Palpitations: Secondary | ICD-10-CM | POA: Diagnosis not present

## 2022-12-27 DIAGNOSIS — Z6826 Body mass index (BMI) 26.0-26.9, adult: Secondary | ICD-10-CM | POA: Diagnosis not present

## 2023-01-03 DIAGNOSIS — M5136 Other intervertebral disc degeneration, lumbar region: Secondary | ICD-10-CM | POA: Diagnosis not present

## 2023-01-03 DIAGNOSIS — M9903 Segmental and somatic dysfunction of lumbar region: Secondary | ICD-10-CM | POA: Diagnosis not present

## 2023-01-03 DIAGNOSIS — M9902 Segmental and somatic dysfunction of thoracic region: Secondary | ICD-10-CM | POA: Diagnosis not present

## 2023-01-03 DIAGNOSIS — M9901 Segmental and somatic dysfunction of cervical region: Secondary | ICD-10-CM | POA: Diagnosis not present

## 2023-01-17 DIAGNOSIS — M5136 Other intervertebral disc degeneration, lumbar region: Secondary | ICD-10-CM | POA: Diagnosis not present

## 2023-01-17 DIAGNOSIS — M9903 Segmental and somatic dysfunction of lumbar region: Secondary | ICD-10-CM | POA: Diagnosis not present

## 2023-01-17 DIAGNOSIS — M9901 Segmental and somatic dysfunction of cervical region: Secondary | ICD-10-CM | POA: Diagnosis not present

## 2023-01-17 DIAGNOSIS — M9902 Segmental and somatic dysfunction of thoracic region: Secondary | ICD-10-CM | POA: Diagnosis not present

## 2023-01-22 ENCOUNTER — Other Ambulatory Visit: Payer: Self-pay | Admitting: Allergy & Immunology

## 2023-02-07 DIAGNOSIS — M5136 Other intervertebral disc degeneration, lumbar region: Secondary | ICD-10-CM | POA: Diagnosis not present

## 2023-02-07 DIAGNOSIS — M9902 Segmental and somatic dysfunction of thoracic region: Secondary | ICD-10-CM | POA: Diagnosis not present

## 2023-02-07 DIAGNOSIS — M9903 Segmental and somatic dysfunction of lumbar region: Secondary | ICD-10-CM | POA: Diagnosis not present

## 2023-02-07 DIAGNOSIS — M9901 Segmental and somatic dysfunction of cervical region: Secondary | ICD-10-CM | POA: Diagnosis not present

## 2023-02-28 DIAGNOSIS — M9902 Segmental and somatic dysfunction of thoracic region: Secondary | ICD-10-CM | POA: Diagnosis not present

## 2023-02-28 DIAGNOSIS — M5136 Other intervertebral disc degeneration, lumbar region: Secondary | ICD-10-CM | POA: Diagnosis not present

## 2023-02-28 DIAGNOSIS — M9903 Segmental and somatic dysfunction of lumbar region: Secondary | ICD-10-CM | POA: Diagnosis not present

## 2023-02-28 DIAGNOSIS — M9901 Segmental and somatic dysfunction of cervical region: Secondary | ICD-10-CM | POA: Diagnosis not present

## 2023-03-14 DIAGNOSIS — M9901 Segmental and somatic dysfunction of cervical region: Secondary | ICD-10-CM | POA: Diagnosis not present

## 2023-03-14 DIAGNOSIS — M5136 Other intervertebral disc degeneration, lumbar region: Secondary | ICD-10-CM | POA: Diagnosis not present

## 2023-03-14 DIAGNOSIS — M9903 Segmental and somatic dysfunction of lumbar region: Secondary | ICD-10-CM | POA: Diagnosis not present

## 2023-03-14 DIAGNOSIS — M9902 Segmental and somatic dysfunction of thoracic region: Secondary | ICD-10-CM | POA: Diagnosis not present

## 2023-03-15 ENCOUNTER — Other Ambulatory Visit: Payer: Self-pay | Admitting: *Deleted

## 2023-03-15 MED ORDER — DUPIXENT 300 MG/2ML ~~LOC~~ SOAJ: 300.0000 mg | SUBCUTANEOUS | 11 refills | Status: DC

## 2023-03-17 DIAGNOSIS — R970 Elevated carcinoembryonic antigen [CEA]: Secondary | ICD-10-CM | POA: Diagnosis not present

## 2023-03-17 DIAGNOSIS — D508 Other iron deficiency anemias: Secondary | ICD-10-CM | POA: Diagnosis not present

## 2023-03-17 DIAGNOSIS — K911 Postgastric surgery syndromes: Secondary | ICD-10-CM | POA: Diagnosis not present

## 2023-03-22 DIAGNOSIS — K912 Postsurgical malabsorption, not elsewhere classified: Secondary | ICD-10-CM | POA: Diagnosis not present

## 2023-03-22 DIAGNOSIS — J8283 Eosinophilic asthma: Secondary | ICD-10-CM | POA: Diagnosis not present

## 2023-03-22 DIAGNOSIS — K911 Postgastric surgery syndromes: Secondary | ICD-10-CM | POA: Diagnosis not present

## 2023-03-22 DIAGNOSIS — D508 Other iron deficiency anemias: Secondary | ICD-10-CM | POA: Diagnosis not present

## 2023-03-22 DIAGNOSIS — R970 Elevated carcinoembryonic antigen [CEA]: Secondary | ICD-10-CM | POA: Diagnosis not present

## 2023-03-28 DIAGNOSIS — R002 Palpitations: Secondary | ICD-10-CM | POA: Diagnosis not present

## 2023-03-28 DIAGNOSIS — Z1239 Encounter for other screening for malignant neoplasm of breast: Secondary | ICD-10-CM | POA: Diagnosis not present

## 2023-03-28 DIAGNOSIS — Z1231 Encounter for screening mammogram for malignant neoplasm of breast: Secondary | ICD-10-CM | POA: Diagnosis not present

## 2023-03-28 DIAGNOSIS — Z6827 Body mass index (BMI) 27.0-27.9, adult: Secondary | ICD-10-CM | POA: Diagnosis not present

## 2023-04-11 DIAGNOSIS — M9901 Segmental and somatic dysfunction of cervical region: Secondary | ICD-10-CM | POA: Diagnosis not present

## 2023-04-11 DIAGNOSIS — M9902 Segmental and somatic dysfunction of thoracic region: Secondary | ICD-10-CM | POA: Diagnosis not present

## 2023-04-11 DIAGNOSIS — M9903 Segmental and somatic dysfunction of lumbar region: Secondary | ICD-10-CM | POA: Diagnosis not present

## 2023-04-11 DIAGNOSIS — M5136 Other intervertebral disc degeneration, lumbar region: Secondary | ICD-10-CM | POA: Diagnosis not present

## 2023-05-02 DIAGNOSIS — M5136 Other intervertebral disc degeneration, lumbar region: Secondary | ICD-10-CM | POA: Diagnosis not present

## 2023-05-02 DIAGNOSIS — M9902 Segmental and somatic dysfunction of thoracic region: Secondary | ICD-10-CM | POA: Diagnosis not present

## 2023-05-02 DIAGNOSIS — M9901 Segmental and somatic dysfunction of cervical region: Secondary | ICD-10-CM | POA: Diagnosis not present

## 2023-05-02 DIAGNOSIS — M9903 Segmental and somatic dysfunction of lumbar region: Secondary | ICD-10-CM | POA: Diagnosis not present

## 2023-05-19 ENCOUNTER — Telehealth: Payer: Self-pay | Admitting: Allergy & Immunology

## 2023-05-19 NOTE — Telephone Encounter (Signed)
I received an email from Melanie Brown regarding some labs that her oncologist had performed that showed an elevated AEC of over 2000. The rest of the results will be scanned into the system.  Because of the elevated AEC, we are going to change to Melanie Brown instead.   See email thread below:   I would take it since it is controlling your breathing and polyps so well.   The other drugs do have copay assistance and free drug for those on Medicare.   Melanie Brown will be in touch about the change to Tezspire.   Melanie Brace, MD  [facility] Adjunct Assistant Professor Department of Pediatrics  Melanie Brown  From: Melanie Brown [email or web address]>  Sent: Thursday, May 19, 2023 5:19 AM To: Melanie Brown [email or web address]> Subject: Re: Issue with recent bloodwork  *Caution - External email - see footer for warnings* I must tell you that I am due a Dupixent injection today, but am waiting to hear from you as to if I should continue taking it or not.   Thank you! Melanie Brown   On May 18, 2023, at 2:42?PM, Melanie Brown [email or web address]> wrote: ?Hey!   Dr. Cathlyn Brown & I haven't addressed these procedures, but I will bring them to his attention when I see him on June 26.  I have a PET Scan scheduled for that day, as well.   As for the previous medications, I have used them all. I had only used Nucala for a short while and was having positive results, but the company pulled it from the market to revamp it. So, maybe we could try it again or the Tezspire.  I am just concerned about the cost. I have been receiving my Dupixent at no out of pocket charge through the Dupixent My Way program. Is it possible to get a similar program for either of these new medications? Would we discontinue the Dupixent program and completely change medications? I am due to schedule a new shipment  and just wanted to  know how to proceed.   As soon as you decide what is best for me, I know you will let me know what to expect and what steps to take.   I await your response.   Melanie Brown   On May 17, 2023, at 9:33?AM, Melanie Brown @[email or web address]> wrote: ?  Hey there!    We know that Dupixent can cause elevated eosinophils, but man oh man that is HIGH! I assume that your oncologist is doing a thorough job with working you up for hypereosinophilic syndromes. Have you had a FIP1L1-PDGFR genetic mutation analysis performed? I assume he has done echocardiogram, lung CT, etc?    To remove Dupixent as a possible cause, I think we should change to Tezspire if only temporarily. If this is all related to the Dupixent, eosinophils should decrease in 4-8 weeks.    Melanie Brown is a new biologic that works ABOVE where [employer], Financial risk analyst, and Xolair work. You have been on Fasenra and Xolair right? Another option is Nucala, which is approved to treat nasal polyps. But I think you might have been on that as well?    Melanie Brace, MD  [facility] Adjunct Assistant Professor Department of Pediatrics  [facility]   From: Melanie Brown [email or web address]>  Sent: Monday,  May 16, 2023 12:01 AM To: Melanie Brown [email or web address]> Subject: Issue with recent bloodwork   *Caution - External email - see footer for warnings* Hello to my favorite doctor!   I made snap shots of my last bloodwork for you to see that the Eosinophils are extra high.   As we discussed in October, after many tests and procedures to find that all was clear with my body, I continue to run a high CEA level.  Articles have stated that high eosinophil levels can affect CEA levels, making them higher. As a  I have Dr. Cathlyn Brown, an Oncologist/Hematologist, watching my case because of my high CEA levels. He was really  concerned about the high Eosinophil levels and wanted me to check in with you.    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    I have enclosed copies of the information )some are repeated, but I am sure you will be able to decipher them.    Questions:   1..Do you think we should make any changes on the amount of Dupixent medication that I am taking or should we continue as is.    2.  Do you need to see me in person?  I will have to work out a trip to [place], or maybe we could do a tele-visit.    I see the Dr. Cathlyn Brown again at the end of June. He wants to do another PET Scan in June so we are having to wait to do the scan until Medicare to approves it.    Just let me know your thoughts as to how to proceed.    I hope all is well with you and your sweet family!  Please tell them hello for me. I look forward to hearing from you, soon.    Appreciatively,  Melanie Brown

## 2023-05-23 DIAGNOSIS — M5136 Other intervertebral disc degeneration, lumbar region: Secondary | ICD-10-CM | POA: Diagnosis not present

## 2023-05-23 DIAGNOSIS — M9903 Segmental and somatic dysfunction of lumbar region: Secondary | ICD-10-CM | POA: Diagnosis not present

## 2023-05-23 DIAGNOSIS — M9902 Segmental and somatic dysfunction of thoracic region: Secondary | ICD-10-CM | POA: Diagnosis not present

## 2023-05-23 DIAGNOSIS — M9901 Segmental and somatic dysfunction of cervical region: Secondary | ICD-10-CM | POA: Diagnosis not present

## 2023-05-24 DIAGNOSIS — Z6827 Body mass index (BMI) 27.0-27.9, adult: Secondary | ICD-10-CM | POA: Diagnosis not present

## 2023-05-24 DIAGNOSIS — E669 Obesity, unspecified: Secondary | ICD-10-CM | POA: Diagnosis not present

## 2023-05-24 DIAGNOSIS — R002 Palpitations: Secondary | ICD-10-CM | POA: Diagnosis not present

## 2023-05-24 DIAGNOSIS — I10 Essential (primary) hypertension: Secondary | ICD-10-CM | POA: Diagnosis not present

## 2023-05-24 NOTE — Telephone Encounter (Signed)
Called patient and advised PAP app mailed to her to get her changed to [facility]

## 2023-05-26 NOTE — Telephone Encounter (Signed)
I talked to [place] today and there is some good nasal polyps data coming out for Tezspire by the end of the year. So hopefully this will be a good change.

## 2023-06-13 DIAGNOSIS — M9903 Segmental and somatic dysfunction of lumbar region: Secondary | ICD-10-CM | POA: Diagnosis not present

## 2023-06-13 DIAGNOSIS — M5136 Other intervertebral disc degeneration, lumbar region: Secondary | ICD-10-CM | POA: Diagnosis not present

## 2023-06-13 DIAGNOSIS — M9902 Segmental and somatic dysfunction of thoracic region: Secondary | ICD-10-CM | POA: Diagnosis not present

## 2023-06-13 DIAGNOSIS — M9901 Segmental and somatic dysfunction of cervical region: Secondary | ICD-10-CM | POA: Diagnosis not present

## 2023-06-15 DIAGNOSIS — K911 Postgastric surgery syndromes: Secondary | ICD-10-CM | POA: Diagnosis not present

## 2023-06-15 DIAGNOSIS — R002 Palpitations: Secondary | ICD-10-CM | POA: Diagnosis not present

## 2023-06-15 DIAGNOSIS — R970 Elevated carcinoembryonic antigen [CEA]: Secondary | ICD-10-CM | POA: Diagnosis not present

## 2023-06-15 DIAGNOSIS — K912 Postsurgical malabsorption, not elsewhere classified: Secondary | ICD-10-CM | POA: Diagnosis not present

## 2023-06-15 DIAGNOSIS — D508 Other iron deficiency anemias: Secondary | ICD-10-CM | POA: Diagnosis not present

## 2023-06-15 DIAGNOSIS — I480 Paroxysmal atrial fibrillation: Secondary | ICD-10-CM | POA: Diagnosis not present

## 2023-06-20 DIAGNOSIS — M9901 Segmental and somatic dysfunction of cervical region: Secondary | ICD-10-CM | POA: Diagnosis not present

## 2023-06-20 DIAGNOSIS — M5136 Other intervertebral disc degeneration, lumbar region: Secondary | ICD-10-CM | POA: Diagnosis not present

## 2023-06-20 DIAGNOSIS — M9902 Segmental and somatic dysfunction of thoracic region: Secondary | ICD-10-CM | POA: Diagnosis not present

## 2023-06-20 DIAGNOSIS — M9903 Segmental and somatic dysfunction of lumbar region: Secondary | ICD-10-CM | POA: Diagnosis not present

## 2023-06-22 DIAGNOSIS — Z6826 Body mass index (BMI) 26.0-26.9, adult: Secondary | ICD-10-CM | POA: Diagnosis not present

## 2023-06-22 DIAGNOSIS — R030 Elevated blood-pressure reading, without diagnosis of hypertension: Secondary | ICD-10-CM | POA: Diagnosis not present

## 2023-06-22 DIAGNOSIS — E669 Obesity, unspecified: Secondary | ICD-10-CM | POA: Diagnosis not present

## 2023-06-22 DIAGNOSIS — R002 Palpitations: Secondary | ICD-10-CM | POA: Diagnosis not present

## 2023-06-27 DIAGNOSIS — E559 Vitamin D deficiency, unspecified: Secondary | ICD-10-CM | POA: Diagnosis not present

## 2023-06-27 DIAGNOSIS — Z9884 Bariatric surgery status: Secondary | ICD-10-CM | POA: Diagnosis not present

## 2023-06-27 DIAGNOSIS — K912 Postsurgical malabsorption, not elsewhere classified: Secondary | ICD-10-CM | POA: Diagnosis not present

## 2023-06-27 DIAGNOSIS — R970 Elevated carcinoembryonic antigen [CEA]: Secondary | ICD-10-CM | POA: Diagnosis not present

## 2023-07-11 DIAGNOSIS — M5136 Other intervertebral disc degeneration, lumbar region: Secondary | ICD-10-CM | POA: Diagnosis not present

## 2023-07-11 DIAGNOSIS — M9901 Segmental and somatic dysfunction of cervical region: Secondary | ICD-10-CM | POA: Diagnosis not present

## 2023-07-11 DIAGNOSIS — M9903 Segmental and somatic dysfunction of lumbar region: Secondary | ICD-10-CM | POA: Diagnosis not present

## 2023-07-11 DIAGNOSIS — M9902 Segmental and somatic dysfunction of thoracic region: Secondary | ICD-10-CM | POA: Diagnosis not present

## 2023-07-22 ENCOUNTER — Telehealth: Payer: Self-pay | Admitting: *Deleted

## 2023-07-22 NOTE — Telephone Encounter (Signed)
Called patient and advised PAP approval and number given to order and sorage and dosing instrux given

## 2023-09-30 ENCOUNTER — Encounter: Payer: Self-pay | Admitting: Allergy & Immunology

## 2023-09-30 ENCOUNTER — Telehealth: Payer: Medicare PPO | Admitting: Allergy & Immunology

## 2023-09-30 DIAGNOSIS — L719 Rosacea, unspecified: Secondary | ICD-10-CM

## 2023-09-30 DIAGNOSIS — J4550 Severe persistent asthma, uncomplicated: Secondary | ICD-10-CM | POA: Diagnosis not present

## 2023-09-30 DIAGNOSIS — J339 Nasal polyp, unspecified: Secondary | ICD-10-CM | POA: Diagnosis not present

## 2023-09-30 DIAGNOSIS — J45909 Unspecified asthma, uncomplicated: Secondary | ICD-10-CM

## 2023-09-30 DIAGNOSIS — Z886 Allergy status to analgesic agent status: Secondary | ICD-10-CM

## 2023-09-30 DIAGNOSIS — J3089 Other allergic rhinitis: Secondary | ICD-10-CM

## 2023-09-30 NOTE — Progress Notes (Signed)
RE: Melanie Brown MRN: [id number] DOB: 1951/04/21 Date of Telemedicine Visit: 09/30/2023  Referring provider: Carylon Perches, MD Primary care provider: Carylon Perches, MD  Chief Complaint: Medication Management   Telemedicine Follow Up Visit via WebEx: I connected with Melanie Brown for a follow up on 09/30/23 by MyChart Epic Video and verified that I am speaking with the correct person using two identifiers.   I discussed the limitations, risks, security and privacy concerns of performing an evaluation and management service by telemedicine and the availability of in person appointments. I also discussed with the patient that there may be a patient responsible charge related to this service. The patient expressed understanding and agreed to proceed.  Patient is at home.  Provider is at the office.  Visit start time: 1:25 PM Visit end time: 1:46 PM Insurance consent/check in by: Dr. Reece Agar Medical consent and medical assistant/nurse: Dr. Reece Agar  History of Present Illness:  She is a 72 y.o. female, who is being followed for persistent asthma as well as nasal polyps and chronic rhinitis. Her previous allergy office visit was in October 2023 with myself.  At that time, we continue with cetirizine 10 mg daily.  For her asthma, her lung testing looked great.  We continue with Dupixent every 2 weeks.  For her flares, she has on Advair 2 puffs twice daily for 1 to 2 weeks as well as her albuterol.  For her acne, we sent in metronidazole gel.  We also sent in doxycycline to bridge her until she saw her dermatologist.  Since last visit, she has done very well.  Asthma/Respiratory Symptom History: Asthma is well controlled. She is on  the [place]. She has had three injections.She has albuterol to have as needed. She has not needed to use it at all. She has not needed her Advair for years. Losing weight has helped as well. She has a five pound window that she stays in.  She has not been on prednisone at all.   She has not been to the emergency room.  Overall, she feels like her breathing is never been better.  She does feel as if the [name] is working better than Dupixent.  Allergic Rhinitis Symptom History: She remains on the cetirizine which is working well for her symptom control. She has not had a sinus infection at all since the last visit. She had some some sinus issues during her migraine in August.  She has been getting a bit more stuffy in the fall. It does not stick around however. There is nothing to severe.  Skin Symptom Symptom History: She is on the Metrogel and this is working well. She is going to be treated for eye mites./ She has to do two drops twice daily for 42 days.   She is felt to just have a higher than normal CEA.  This was discovered because she had this done as part of the workup for insurance.  She followed with a doctor for this elevated CEA, but has been felt that this is not related to anything.  She is just being followed as needed now.  She is using Chartered certified accountant and [employer] to get more protein. She has a number of different vitamins as well that she needs to take. This is due to her gastric bypass.  She does eat a regular meal at dinnertime.  She did have a couple of migraines this year but nothing too severe. This was in August when she had this.  She thinks that this might be related to the lighting and they bothered her eyes. She put some shading on the lights.  She also put some drapes on the windows.  This helped a lot.  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 She was not intending to teach again, but she had so much energy after the gastric bypass that she decided to go back into the classroom. Her daughter teaches 5th grade at the same school.    Otherwise, there have been no changes to her past medical history, surgical history, family history, or social history.  Assessment and Plan:  Melanie Brown is  a 72 y.o. female with:  Severe persistent asthma without complication   Nasal polyposis with Samter's triad   Perennial allergic rhinitis (molds)   Acne rosacea    S/p gastric bypass (February 2021) - with 175 pound weight loss 1. Chronic allergic rhinitis - Continue with cetirizine 10mg  daily. - You are doing AMAZING!!!   2. Severe persistent asthma, uncomplicated with coexisting aspirin exacerbated respiratory disease (Samter's Triad) - Lung testing not done at all. - You are in a good space.  - Daily controller medication(s): Advair one puff once daily + Tezspire every month  - Rescue medications: albuterol 4 puffs every 4-6 hours as needed or albuterol nebulizer one vial puffs every 4-6 hours as needed - Changes during respiratory infections or worsening symptoms: INCREASE Advair to two puffs twice daily for 1-2 weeks - Asthma control goals:  * Full participation in all desired activities (may need albuterol before activity) * Albuterol use two time or less a week on average (not counting use with activity) * Cough interfering with sleep two time or less a month * Oral steroids no more than once a year * No hospitalizations  3. Acne rosacea  - Continue with metronidazole gel which is also used to treat rosacea.   4. Return in about 1 year (around 09/29/2024).      Diagnostics: None.  Medication List:  Current Outpatient Medications  Medication Sig Dispense Refill   Lotilaner (XDEMVY) 0.25 % SOLN Apply 1 drop to eye in the morning and at bedtime.     albuterol (VENTOLIN HFA) 108 (90 Base) MCG/ACT inhaler Inhale 2 puffs into the lungs every 4 (four) hours as needed for wheezing or shortness of breath. 18 g 2   Calcium Phosphate Tribasic POWD Vitamin D3     cetirizine (ZYRTEC) 10 MG tablet Take 1 tablet (10 mg total) by mouth daily. 30 tablet 5   Cholecalciferol (VITAMIN D3) 1.25 MG (50000 UT) CAPS Vitamin D3     COVID-19 At Home Test [facility] COVID-19 AG HOME TEST  VI) BinaxNOW COVID-19 Ag Self Test kit  Use as Directed on the Package     ipratropium-albuterol (DUONEB) 0.5-2.5 (3) MG/3ML SOLN Take 3 mLs by nebulization every 4 (four) hours as needed (CONGESTION, COUGH). 90 mL 1   metroNIDAZOLE (METROGEL) 1 % gel Apply topically daily. 45 g 3   Multiple Vitamins-Minerals (MULTI-VITAMIN/MINERALS PO) Take by mouth.     omeprazole (PRILOSEC) 40 MG capsule Take 40 mg by mouth 2 (two) times daily.     Respiratory Therapy Supplies (NEBULIZER) DEVI Use As Directed 1 each 0   SUMAtriptan (IMITREX) 100 MG tablet Take 100 mg by mouth every 2 (two) hours as needed for migraine.   6   No current facility-administered medications for this visit.   Allergies: Allergies  Allergen Reactions   Aspirin Shortness Of Breath and Rash   Chloramphenicols Shortness Of Breath    Felt "whoozy"   Chloropyramine Hcl    Procaine Hcl Other (See Comments)     muscle spasm of neck   Penicillins Rash    Did it involve swelling of the face/tongue/throat, SOB, or low BP? Yes Did it involve sudden or severe rash/hives, skin peeling, or any reaction on the inside of your mouth or nose? Yes Did you need to seek medical attention at a hospital or doctor's office? No When did it last happen? more than 30 years      If all above answers are "NO", may proceed with cephalosporin use.    I reviewed her past medical history, social history, family history, and environmental history and no significant changes have been reported from previous visits.  Review of Systems  Constitutional: Negative.  Negative for fever.  HENT: Negative.  Negative for congestion, ear discharge, ear pain, postnasal drip, rhinorrhea, sinus pressure, sinus pain and sneezing.   Eyes:  Negative for pain, discharge, redness and itching.  Respiratory:  Negative for cough, shortness of breath and wheezing.   Cardiovascular: Negative.  Negative for chest pain and palpitations.  Gastrointestinal:  Negative for abdominal  pain.  Skin: Negative.  Negative for rash.  Allergic/Immunologic: Positive for environmental allergies. Negative for food allergies.  Neurological:  Negative for dizziness and headaches.  Hematological:  Does not bruise/bleed easily.    Objective:  Physical exam not obtained as encounter was done via telephone.   Previous notes and tests were reviewed.  I discussed the assessment and treatment plan with the patient. The patient was provided an opportunity to ask questions and all were answered. The patient agreed with the plan and demonstrated an understanding of the instructions.   The patient was advised to call back or seek an in-person evaluation if the symptoms worsen or if the condition fails to improve as anticipated.  I provided 21 minutes of non-face-to-face time during this encounter.  It was my pleasure to participate in [facility] care today. Please feel free to contact me with any questions or concerns.   Sincerely,  Alfonse Spruce, MD

## 2023-09-30 NOTE — Patient Instructions (Addendum)
1. Chronic allergic rhinitis - Continue with cetirizine 10mg  daily. - You are doing AMAZING!!!   2. Severe persistent asthma, uncomplicated with coexisting aspirin exacerbated respiratory disease (Samter's Triad) - Lung testing not done at all. - You are in a good space.  - Daily controller medication(s): Advair one puff once daily + Tezspire every month  - Rescue medications: albuterol 4 puffs every 4-6 hours as needed or albuterol nebulizer one vial puffs every 4-6 hours as needed - Changes during respiratory infections or worsening symptoms: INCREASE Advair to two puffs twice daily for 1-2 weeks - Asthma control goals:  * Full participation in all desired activities (may need albuterol before activity) * Albuterol use two time or less a week on average (not counting use with activity) * Cough interfering with sleep two time or less a month * Oral steroids no more than once a year * No hospitalizations  3. Acne rosacea  - Continue with metronidazole gel which is also used to treat rosacea.   4. Return in about 1 year (around 09/29/2024).    Please inform us of any Emergency Department visits, hospitalizations, or changes in symptoms. Call us before going to the ED for breathing or allergy symptoms since we might be able to fit you in for a sick visit. Feel free to contact us anytime with any questions, problems, or concerns.  It was a pleasure to see you again today!  Websites that have reliable patient information: 1. American Academy of Asthma, Allergy, and Immunology: www.aaaai.org 2. Food Allergy Research and Education (FARE): foodallergy.org 3. Mothers of Asthmatics: http://www.asthmacommunitynetwork.org 4. American College of Allergy, Asthma, and Immunology: www.acaai.org   COVID-19 Vaccine Information can be found at: PodExchange.nl For questions related to vaccine distribution or appointments, please email  [email or web address] or call [phone].     "Like" [place] on Facebook and Instagram for our latest updates!      Make sure you are registered to vote! If you have moved or changed any of your contact information, you will need to get this updated before voting!  In some cases, you MAY be able to register to vote online: [email or web address]

## 2023-12-13 ENCOUNTER — Ambulatory Visit: Payer: Medicare PPO | Admitting: Allergy & Immunology

## 2023-12-21 ENCOUNTER — Other Ambulatory Visit: Payer: Self-pay | Admitting: *Deleted

## 2023-12-21 MED ORDER — TEZSPIRE 210 MG/1.91ML ~~LOC~~ SOAJ: 210.0000 mg | SUBCUTANEOUS | 11 refills | Status: DC

## 2024-02-14 ENCOUNTER — Other Ambulatory Visit (HOSPITAL_COMMUNITY): Payer: Self-pay

## 2024-02-14 ENCOUNTER — Telehealth: Payer: Self-pay

## 2024-02-14 ENCOUNTER — Other Ambulatory Visit: Payer: Self-pay | Admitting: *Deleted

## 2024-02-14 ENCOUNTER — Telehealth: Payer: Self-pay | Admitting: *Deleted

## 2024-02-14 MED ORDER — TEZSPIRE 210 MG/1.91ML ~~LOC~~ SOAJ: 210.0000 mg | SUBCUTANEOUS | 11 refills | Status: DC

## 2024-02-14 NOTE — Telephone Encounter (Signed)
 Spoke to patient and advised she doesn't meet criteria for Tezspire PAP due to her copay being $100. She does not want to pay for Tezspire because she doesn't think it is working that well but would like to try to get on Nucala PAP. Please advise

## 2024-02-14 NOTE — Telephone Encounter (Signed)
 Test claim for [name] to work on possible PAP-test claim shows PA needed-[name] notified

## 2024-02-15 NOTE — Telephone Encounter (Signed)
 Nucala is fine with me.   Malachi Bonds, MD [facility]

## 2024-02-16 ENCOUNTER — Other Ambulatory Visit (HOSPITAL_COMMUNITY): Payer: Self-pay

## 2024-02-22 NOTE — Telephone Encounter (Signed)
 Mailed app to patient for nUcala

## 2024-04-11 ENCOUNTER — Other Ambulatory Visit (HOSPITAL_COMMUNITY): Payer: Self-pay

## 2024-04-11 ENCOUNTER — Other Ambulatory Visit: Payer: Self-pay | Admitting: *Deleted

## 2024-04-11 MED ORDER — FASENRA PEN 30 MG/ML ~~LOC~~ SOAJ: 30.0000 mg | SUBCUTANEOUS | 9 refills | Status: DC

## 2024-04-11 MED ORDER — NUCALA 100 MG/ML ~~LOC~~ SOAJ: 100.0000 mg | SUBCUTANEOUS | 11 refills | Status: DC

## 2024-04-11 NOTE — Addendum Note (Signed)
 Addended by: Evangelina Hilt on: 04/11/2024 01:43 PM   Modules accepted: Orders

## 2024-04-11 NOTE — Addendum Note (Signed)
 Addended by: Evangelina Hilt on: 04/11/2024 02:01 PM   Modules accepted: Orders

## 2024-05-22 ENCOUNTER — Telehealth: Payer: Self-pay | Admitting: *Deleted

## 2024-05-22 NOTE — Telephone Encounter (Signed)
 Patient had called and l.m regarding new phone number I reached out to advise her approval for nucalaa and number given to call to order same

## 2025-01-25 ENCOUNTER — Other Ambulatory Visit: Payer: Self-pay | Admitting: *Deleted

## 2025-01-25 MED ORDER — NUCALA 100 MG/ML ~~LOC~~ SOAJ: 100.0000 mg | SUBCUTANEOUS | 11 refills | Status: AC
